# Patient Record
Sex: Male | Born: 1945 | Race: Black or African American | Hispanic: No | Smoking: Former smoker
Health system: Southern US, Community
[De-identification: ages and names within clinical notes are randomized; demographics above are authoritative.]

## PROBLEM LIST (undated history)

## (undated) DIAGNOSIS — R911 Solitary pulmonary nodule: Secondary | ICD-10-CM

## (undated) DIAGNOSIS — N3090 Cystitis, unspecified without hematuria: Secondary | ICD-10-CM

## (undated) DIAGNOSIS — N410 Acute prostatitis: Secondary | ICD-10-CM

## (undated) HISTORY — DX: Solitary pulmonary nodule: R91.1

## (undated) HISTORY — DX: Cystitis, unspecified without hematuria: N30.90

## (undated) HISTORY — DX: Acute prostatitis: N41.0

---

## 2007-08-19 HISTORY — PX: COLONOSCOPY: SHX174

## 2007-08-19 LAB — HM COLONOSCOPY

## 2015-07-02 DIAGNOSIS — R109 Unspecified abdominal pain: Secondary | ICD-10-CM | POA: Diagnosis not present

## 2015-07-02 DIAGNOSIS — Z743 Need for continuous supervision: Secondary | ICD-10-CM | POA: Diagnosis not present

## 2016-04-08 LAB — URINALYSIS, COMPLETE
Bilirubin, UA: NEGATIVE
GLUCOSE, UA: NEGATIVE
Ketones, UA: NEGATIVE
NITRITE UA: NEGATIVE
OCCULT 6: NEGATIVE
PH UA: 7 (ref 4.5–8.0)
PROTEIN UA: NEGATIVE
Prostate Specific Ag, Serum: 2.6
SPEC GRAV UA: 1.008
Urobilinogen, Ur: 0.2

## 2016-05-02 ENCOUNTER — Ambulatory Visit: Payer: Medicare (Managed Care) | Admitting: Internal Medicine

## 2016-08-06 ENCOUNTER — Encounter: Payer: Self-pay | Admitting: Internal Medicine

## 2016-08-06 ENCOUNTER — Ambulatory Visit (INDEPENDENT_AMBULATORY_CARE_PROVIDER_SITE_OTHER): Payer: Medicare Other | Admitting: Internal Medicine

## 2016-08-06 VITALS — BP 124/82 | HR 90 | Temp 98.4°F | Resp 12 | Ht 74.0 in | Wt 208.0 lb

## 2016-08-06 DIAGNOSIS — R918 Other nonspecific abnormal finding of lung field: Secondary | ICD-10-CM

## 2016-08-06 DIAGNOSIS — Z87891 Personal history of nicotine dependence: Secondary | ICD-10-CM

## 2016-08-06 DIAGNOSIS — F1290 Cannabis use, unspecified, uncomplicated: Secondary | ICD-10-CM

## 2016-08-06 DIAGNOSIS — R61 Generalized hyperhidrosis: Secondary | ICD-10-CM | POA: Insufficient documentation

## 2016-08-06 DIAGNOSIS — Z77090 Contact with and (suspected) exposure to asbestos: Secondary | ICD-10-CM

## 2016-08-06 DIAGNOSIS — Z136 Encounter for screening for cardiovascular disorders: Secondary | ICD-10-CM | POA: Diagnosis not present

## 2016-08-06 LAB — CBC WITH DIFFERENTIAL/PLATELET
BASOS ABS: 0 {cells}/uL (ref 0–200)
Basophils Relative: 0 %
EOS ABS: 58 {cells}/uL (ref 15–500)
Eosinophils Relative: 1 %
HEMATOCRIT: 44 % (ref 38.5–50.0)
HEMOGLOBIN: 14.8 g/dL (ref 13.2–17.1)
LYMPHS ABS: 1914 {cells}/uL (ref 850–3900)
LYMPHS PCT: 33 %
MCH: 28.8 pg (ref 27.0–33.0)
MCHC: 33.6 g/dL (ref 32.0–36.0)
MCV: 85.6 fL (ref 80.0–100.0)
MONO ABS: 580 {cells}/uL (ref 200–950)
MPV: 9.9 fL (ref 7.5–12.5)
Monocytes Relative: 10 %
NEUTROS PCT: 56 %
Neutro Abs: 3248 cells/uL (ref 1500–7800)
Platelets: 240 10*3/uL (ref 140–400)
RBC: 5.14 MIL/uL (ref 4.20–5.80)
RDW: 14.6 % (ref 11.0–15.0)
WBC: 5.8 10*3/uL (ref 3.8–10.8)

## 2016-08-06 NOTE — Progress Notes (Signed)
Patient ID: Theodore Johnson, male   DOB: [birth year], 70 y.o.   MRN: [id number]    Location:  [facility] Place of Service: OFFICE    Advanced Directive information    Chief Complaint  Patient presents with  . Establish Care    New Patient Establish Care, patient moved here from [place]. Patient concerned about nodule found on lung via Xray in [place]. Patient fasting for any labs due     HPI:  70 yo male seen today as a new pt. He is c/a pulmonary nodule dx 21 yrs ago but he was lost to f/u. He had imaging done in 2016 and was referred to pulmonary. 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 He did not always wear a mask when removing asbestose. No hemoptysis, weight loss. He does have night sweats mostly affects pillowcase. No entire body sweats. He stopped smoking 91yr ago but uses marijuana occasionally.  He relocated from [place] in Feb 2017. He is retired from g[employer] Corporationwork as wBuilding control surveyor Mom expired at age [age]on Jan 16, 2016 from natural causes  Past Medical History:  Diagnosis Date  . Cystitis   . Prostatitis, acute   . Solitary pulmonary nodule     Past Surgical History:  Procedure Laterality Date  . COLONOSCOPY  08/19/2007    Patient Care Team: MGildardo Cranker DO as PCP - General (Internal Medicine)  Social History   Social History  . Marital status: Divorced    Spouse name: N/A  . Number of children: N/A  . Years of education: N/A   Occupational History  . Not on file.   Social History Main Topics  . Smoking status: Former SResearch scientist (life sciences) . Smokeless tobacco: Never Used     Comment: Quit 37 years ago as of 08/06/16  . Alcohol use Yes  . Drug use: No  . Sexual activity: Not on file   Other Topics Concern  . Not on file   Social History Narrative   Diet? N/A      Do you drink/eat things with caffeine? no      Marital status?      divorced                              What year were you married? N/A      Do you live in a house, apartment, assisted living, condo, trailer, etc.? apartment      Is it one or more stories? no      How many persons live in your home? 1      Do you have any pets in your home? (please list) no      Current or past profession: welder      Do you exercise?           yes                           Type & how often?      Do you have a living will? no      Do you have a DNR form?  yes                                If not, do you want to discuss one?  Do you have signed POA/HPOA for forms?  no        reports that he has quit smoking. He has never used smokeless tobacco. He reports that he drinks alcohol. He reports that he does not use drugs.  Family History  Problem Relation Age of Onset  . Hypertension Mother    Family Status  Relation Status  . Mother Deceased  . Father Deceased at age 63  . Sister Deceased  . Brother Alive  . Brother Alive  . Brother Alive  . Brother Deceased  . Brother Alive  . Brother Alive  . Daughter Alive  . Daughter Alive  . Son Alive  . Daughter Alive  . Daughter Alive  . Son Alive     There is no immunization history on file for this patient.  No Known Allergies  Medications: Patient's Medications   No medications on file    Review of Systems  Constitutional: Positive for diaphoresis.  All other systems reviewed and are negative.   Vitals:   08/06/16 1112  BP: 124/82  Pulse: 90  Resp: 12  Temp: 98.4 F (36.9 C)  TempSrc: Oral  SpO2: 98%  Weight: 208 lb (94.3 kg)  Height: 6' 2"  (1.88 m)   Body mass index is 26.71 kg/m.  Physical Exam  Constitutional: He is oriented to person, place, and time. He appears well-developed and well-nourished.  HENT:  Mouth/Throat: Oropharynx is clear and moist.  Eyes: Pupils are equal, round, and reactive to light. No scleral icterus.  Neck: Neck supple. Carotid bruit is not present. No thyromegaly present.  Cardiovascular: Normal rate, regular rhythm,  normal heart sounds and intact distal pulses.  Exam reveals no gallop and no friction rub.   No murmur heard. no distal LE swelling. No calf TTP  Pulmonary/Chest: Effort normal. He has decreased breath sounds (b/l throughout). He has no wheezes. He has no rhonchi. He has rales. He exhibits no tenderness.  Abdominal: Soft. Bowel sounds are normal. He exhibits no distension, no abdominal bruit, no pulsatile midline mass and no mass. There is no hepatomegaly. There is no tenderness. There is no rebound and no guarding.  Musculoskeletal: He exhibits edema.  Lymphadenopathy:    He has no cervical adenopathy.  Neurological: He is alert and oriented to person, place, and time.  Skin: Skin is warm and dry. No rash noted.  Psychiatric: He has a normal mood and affect. His behavior is normal. Judgment and thought content normal.     Labs reviewed: No visits with results within 3 Month(s) from this visit.  Latest known visit with results is:  Abstract on 04/17/2016  Component Date Value Ref Range Status  . HM Colonoscopy 08/19/2007 Patient Reported  See Report (in chart), Patient Reported Final    No results found.   Assessment/Plan   ICD-9-CM ICD-10-CM   1. Pulmonary nodules 793.19 R91.8 DG Chest 2 View  2. Screening for cardiovascular condition V81.2 Z13.6 CMP with eGFR     Lipid Panel  3. Night sweats 780.8 R61 CMP with eGFR     CBC with Differential/Platelets     TSH     Urinalysis with Reflex Microscopic  4. Exposure to asbestos V15.84 Z77.090 CMP with eGFR     CBC with Differential/Platelets  5. History of tobacco abuse V15.82 Z87.891 DG Chest 2 View  6. 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 S. Perlie Gold  [facility] [address] [place], [place] [address] [phone] Cell (Monday-Friday 8 AM - 5 PM) [phone] After 5 PM and follow prompts

## 2016-08-06 NOTE — Patient Instructions (Signed)
Will call with lab results and imaging results  Get old records  Follow up for AWV/CPE next available

## 2016-08-07 LAB — COMPLETE METABOLIC PANEL WITH GFR
ALT: 21 U/L (ref 9–46)
AST: 21 U/L (ref 10–35)
Albumin: 4.4 g/dL (ref 3.6–5.1)
Alkaline Phosphatase: 58 U/L (ref 40–115)
BUN: 11 mg/dL (ref 7–25)
CHLORIDE: 105 mmol/L (ref 98–110)
CO2: 26 mmol/L (ref 20–31)
CREATININE: 0.99 mg/dL (ref 0.70–1.25)
Calcium: 10 mg/dL (ref 8.6–10.3)
GFR, Est African American: >89 mL/min (ref >=60)
GFR, Est Non African American: 78 mL/min (ref >=60)
GLUCOSE: 83 mg/dL (ref 65–99)
POTASSIUM: 4.6 mmol/L (ref 3.5–5.3)
SODIUM: 141 mmol/L (ref 135–146)
Total Bilirubin: 0.7 mg/dL (ref 0.2–1.2)
Total Protein: 8 g/dL (ref 6.1–8.1)

## 2016-08-07 LAB — URINALYSIS, MICROSCOPIC ONLY
Bacteria, UA: NONE SEEN [HPF]
CRYSTALS: NONE SEEN [HPF]
Casts: NONE SEEN [LPF]
Yeast: NONE SEEN [HPF]

## 2016-08-07 LAB — URINALYSIS, ROUTINE W REFLEX MICROSCOPIC
Bilirubin Urine: NEGATIVE
Glucose, UA: NEGATIVE
HGB URINE DIPSTICK: NEGATIVE
KETONES UR: NEGATIVE
NITRITE: NEGATIVE
PH: 5.5 (ref 5.0–8.0)
PROTEIN: NEGATIVE
Specific Gravity, Urine: 1.023 (ref 1.001–1.035)

## 2016-08-07 LAB — TSH: TSH: 2.13 m[IU]/L (ref 0.40–4.50)

## 2016-08-07 LAB — LIPID PANEL
Cholesterol: 308 mg/dL — ABNORMAL HIGH (ref <200)
HDL: 56 mg/dL (ref >40)
LDL CALC: 201 mg/dL — AB (ref <100)
TRIGLYCERIDES: 256 mg/dL — AB (ref <150)
Total CHOL/HDL Ratio: 5.5 Ratio — ABNORMAL HIGH (ref <5.0)
VLDL: 51 mg/dL — AB (ref <30)

## 2016-08-12 ENCOUNTER — Other Ambulatory Visit: Payer: Self-pay

## 2016-08-12 DIAGNOSIS — E785 Hyperlipidemia, unspecified: Secondary | ICD-10-CM

## 2016-08-12 MED ORDER — ATORVASTATIN CALCIUM 20 MG PO TABS: 20.0000 mg | ORAL_TABLET | Freq: Every day | ORAL | 6 refills | Status: DC

## 2016-08-12 NOTE — Addendum Note (Signed)
Addended by: Chriss DriverLANE, TERRY L on: 08/12/2016 09:22 AM   Modules accepted: Orders

## 2016-09-02 ENCOUNTER — Other Ambulatory Visit: Payer: Self-pay | Admitting: *Deleted

## 2016-09-02 MED ORDER — ATORVASTATIN CALCIUM 20 MG PO TABS: 20.0000 mg | ORAL_TABLET | Freq: Every day | ORAL | 6 refills | Status: DC

## 2016-09-02 NOTE — Telephone Encounter (Signed)
Patient wanted a Rx faxed to [facility] instead of [facility] because it is cheaper. Faxed.

## 2016-09-12 ENCOUNTER — Other Ambulatory Visit: Payer: Self-pay

## 2016-09-19 ENCOUNTER — Other Ambulatory Visit: Payer: Self-pay

## 2016-09-24 ENCOUNTER — Other Ambulatory Visit: Payer: Self-pay

## 2016-10-07 ENCOUNTER — Other Ambulatory Visit: Payer: Medicare HMO

## 2016-10-07 DIAGNOSIS — E785 Hyperlipidemia, unspecified: Secondary | ICD-10-CM | POA: Diagnosis not present

## 2016-10-07 LAB — LIPID PANEL
CHOL/HDL RATIO: 6.3 ratio — AB (ref <5.0)
Cholesterol: 285 mg/dL — ABNORMAL HIGH (ref <200)
HDL: 45 mg/dL (ref >40)
LDL Cholesterol: 189 mg/dL — ABNORMAL HIGH (ref <100)
Triglycerides: 255 mg/dL — ABNORMAL HIGH (ref <150)
VLDL: 51 mg/dL — ABNORMAL HIGH (ref <30)

## 2016-10-07 LAB — ALT: ALT: 18 U/L (ref 9–46)

## 2016-10-17 ENCOUNTER — Ambulatory Visit: Payer: Medicare (Managed Care)

## 2016-10-17 ENCOUNTER — Encounter: Payer: Medicare HMO | Admitting: Internal Medicine

## 2016-10-17 ENCOUNTER — Ambulatory Visit (INDEPENDENT_AMBULATORY_CARE_PROVIDER_SITE_OTHER): Payer: Medicare HMO

## 2016-10-17 ENCOUNTER — Ambulatory Visit (INDEPENDENT_AMBULATORY_CARE_PROVIDER_SITE_OTHER): Payer: Medicare HMO | Admitting: Internal Medicine

## 2016-10-17 ENCOUNTER — Encounter: Payer: Self-pay | Admitting: Internal Medicine

## 2016-10-17 VITALS — BP 150/82 | HR 72 | Temp 98.0°F | Ht 74.0 in | Wt 211.6 lb

## 2016-10-17 DIAGNOSIS — F1290 Cannabis use, unspecified, uncomplicated: Secondary | ICD-10-CM | POA: Diagnosis not present

## 2016-10-17 DIAGNOSIS — Z0189 Encounter for other specified special examinations: Secondary | ICD-10-CM

## 2016-10-17 DIAGNOSIS — Z87891 Personal history of nicotine dependence: Secondary | ICD-10-CM

## 2016-10-17 DIAGNOSIS — Z23 Encounter for immunization: Secondary | ICD-10-CM | POA: Diagnosis not present

## 2016-10-17 DIAGNOSIS — Z0000 Encounter for general adult medical examination without abnormal findings: Secondary | ICD-10-CM

## 2016-10-17 DIAGNOSIS — Z77090 Contact with and (suspected) exposure to asbestos: Secondary | ICD-10-CM | POA: Diagnosis not present

## 2016-10-17 DIAGNOSIS — N489 Disorder of penis, unspecified: Secondary | ICD-10-CM | POA: Diagnosis not present

## 2016-10-17 DIAGNOSIS — R918 Other nonspecific abnormal finding of lung field: Secondary | ICD-10-CM | POA: Diagnosis not present

## 2016-10-17 DIAGNOSIS — H543 Unqualified visual loss, both eyes: Secondary | ICD-10-CM

## 2016-10-17 DIAGNOSIS — R69 Illness, unspecified: Secondary | ICD-10-CM | POA: Diagnosis not present

## 2016-10-17 DIAGNOSIS — E782 Mixed hyperlipidemia: Secondary | ICD-10-CM | POA: Diagnosis not present

## 2016-10-17 MED ORDER — ATORVASTATIN CALCIUM 20 MG PO TABS: 20.0000 mg | ORAL_TABLET | Freq: Every day | ORAL | 6 refills | Status: DC

## 2016-10-17 MED ORDER — TADALAFIL 20 MG PO TABS: 20.0000 mg | ORAL_TABLET | Freq: Every day | ORAL | 6 refills | Status: DC | PRN

## 2016-10-17 NOTE — Patient Instructions (Addendum)
Theodore Johnson , Thank you for taking time to come for your Medicare Wellness Visit. I appreciate your ongoing commitment to your health goals. Please review the following plan we discussed and let me know if I can assist you in the future.   These are the goals we discussed: Goals    . <enter goal here>          Starting 10/17/16, I will maintain my current lifestyle.        This is a list of the screening recommended for you and due dates:  Health Maintenance  Topic Date Due  .  Hepatitis C: One time screening is recommended by Center for Disease Control  (CDC) for  adults born from [birth year] through 1964.   1946-01-31  . Flu Shot  08/18/2018*  . Tetanus Vaccine  08/18/2018*  . Pneumonia vaccines (1 of 2 - PCV13) 08/18/2018*  . Colon Cancer Screening  08/18/2017  *Topic was postponed. The date shown is not the original due date.  Preventive Care for Adults  A healthy lifestyle and preventive care can promote health and wellness. Preventive health guidelines for adults include the following key practices.  . A routine yearly physical is a good way to check with your health care provider about your health and preventive screening. It is a chance to share any concerns and updates on your health and to receive a thorough exam.  . Visit your dentist for a routine exam and preventive care every 6 months. Brush your teeth twice a day and floss once a day. Good oral hygiene prevents tooth decay and gum disease.  . The frequency of eye exams is based on your age, health, family medical history, use  of contact lenses, and other factors. Follow your health care provider's ecommendations for frequency of eye exams.  . Eat a healthy diet. Foods like vegetables, fruits, whole grains, low-fat dairy products, and lean protein foods contain the nutrients you need without too many calories. Decrease your intake of foods high in solid fats, added sugars, and salt. Eat the right amount of calories for you. Get  information about a proper diet from your health care provider, if necessary.  . Regular physical exercise is one of the most important things you can do for your health. Most adults should get at least 150 minutes of moderate-intensity exercise (any activity that increases your heart rate and causes you to sweat) each week. In addition, most adults need muscle-strengthening exercises on 2 or more days a week.  Silver Sneakers may be a benefit available to you. To determine eligibility, you may visit the website: www.silversneakers.com or contact program at [phone] Mon-Fri between 8AM-8PM.   . Maintain a healthy weight. The body mass index (BMI) is a screening tool to identify possible weight problems. It provides an estimate of body fat based on height and weight. Your health care provider can find your BMI and can help you achieve or maintain a healthy weight.   For adults 20 years and older: ? A BMI below 18.5 is considered underweight. ? A BMI of 18.5 to 24.9 is normal. ? A BMI of 25 to 29.9 is considered overweight. ? A BMI of 30 and above is considered obese.   . Maintain normal blood lipids and cholesterol levels by exercising and minimizing your intake of saturated fat. Eat a balanced diet with plenty of fruit and vegetables. Blood tests for lipids and cholesterol should begin at age 70 and be repeated every 5  years. If your lipid or cholesterol levels are high, you are over 50, or you are at high risk for heart disease, you may need your cholesterol levels checked more frequently. Ongoing high lipid and cholesterol levels should be treated with medicines if diet and exercise are not working.  . If you smoke, find out from your health care provider how to quit. If you do not use tobacco, please do not start.  . If you choose to drink alcohol, please do not consume more than 2 drinks per day. One drink is considered to be 12 ounces (355 mL) of beer, 5 ounces (148 mL) of wine, or 1.5  ounces (44 mL) of liquor.  . If you are 40-66 years old, ask your health care provider if you should take aspirin to prevent strokes.  . Use sunscreen. Apply sunscreen liberally and repeatedly throughout the day. You should seek shade when your shadow is shorter than you. Protect yourself by wearing long sleeves, pants, a wide-brimmed hat, and sunglasses year round, whenever you are outdoors.  . Once a month, do a whole body skin exam, using a mirror to look at the skin on your back. Tell your health care provider of new moles, moles that have irregular borders, moles that are larger than a pencil eraser, or moles that have changed in shape or color.

## 2016-10-17 NOTE — Patient Instructions (Signed)
Encouraged him to exercise 30-45 minutes 4-5 times per week. Eat a well balanced diet. Avoid smoking. Limit alcohol intake. Wear seatbelt when riding in the car. Wear sun block (SPF >50) when spending extended times outside.  PREVNAR vaccine given today. Will need pneumovax at next visit  Will call with urology referral  REMEMBER TO GET CHEST XRAY  Continue current medications as ordered  Follow up in 6 mos for hyperlipidemia. Fasting lab prior to appt   Fat and Cholesterol Restricted Diet Getting too much fat and cholesterol in your diet may cause health problems. Following this diet helps keep your fat and cholesterol at normal levels. This can keep you from getting sick. What types of fat should I choose?  Choose monosaturated and polyunsaturated fats. These are found in foods such as olive oil, canola oil, flaxseeds, walnuts, almonds, and seeds.  Eat more omega-3 fats. Good choices include salmon, mackerel, sardines, tuna, flaxseed oil, and ground flaxseeds.  Limit saturated fats. These are in animal products such as meats, butter, and cream. They can also be in plant products such as palm oil, palm kernel oil, and coconut oil.  Avoid foods with partially hydrogenated oils in them. These contain trans fats. Examples of foods that have trans fats are stick margarine, some tub margarines, cookies, crackers, and other baked goods. What general guidelines do I need to follow?  Check food labels. Look for the words "trans fat" and "saturated fat."  When preparing a meal:  Fill half of your plate with vegetables and green salads.  Fill one fourth of your plate with whole grains. Look for the word "whole" as the first word in the ingredient list.  Fill one fourth of your plate with lean protein foods.  Eat more foods that have fiber, like apples, carrots, beans, peas, and barley.  Eat more home-cooked foods. Eat less at restaurants and buffets.  Limit or avoid alcohol.  Limit  foods high in starch and sugar.  Limit fried foods.  Cook foods without frying them. Baking, boiling, grilling, and broiling are all great options.  Lose weight if you are overweight. Losing even a small amount of weight can help your overall health. It can also help prevent diseases such as diabetes and heart disease. What foods can I eat? Grains  Whole grains, such as whole wheat or whole grain breads, crackers, cereals, and pasta. Unsweetened oatmeal, bulgur, barley, quinoa, or brown rice. Corn or whole wheat flour tortillas. Vegetables  Fresh or frozen vegetables (raw, steamed, roasted, or grilled). Green salads. Fruits  All fresh, canned (in natural juice), or frozen fruits. Meat and Other Protein Products  Ground beef (85% or leaner), grass-fed beef, or beef trimmed of fat. Skinless chicken or [place]. Ground chicken or [place]. Pork trimmed of fat. All fish and seafood. Eggs. Dried beans, peas, or lentils. Unsalted nuts or seeds. Unsalted canned or dry beans. Dairy  Low-fat dairy products, such as skim or 1% milk, 2% or reduced-fat cheeses, low-fat ricotta or cottage cheese, or plain low-fat yogurt. Fats and Oils  Tub margarines without trans fats. Light or reduced-fat mayonnaise and salad dressings. Avocado. Olive, canola, sesame, or safflower oils. Natural peanut or almond butter (choose ones without added sugar and oil). The items listed above may not be a complete list of recommended foods or beverages. Contact your dietitian for more options.  What foods are not recommended? Grains  White bread. White pasta. White rice. Cornbread. Bagels, pastries, and croissants. Crackers that contain trans fat.  Vegetables  White potatoes. Corn. Creamed or fried vegetables. Vegetables in a cheese sauce. Fruits  Dried fruits. Canned fruit in light or heavy syrup. Fruit juice. Meat and Other Protein Products  Fatty cuts of meat. Ribs, chicken wings, bacon, sausage, bologna, salami, chitterlings,  fatback, hot dogs, bratwurst, and packaged luncheon meats. Liver and organ meats. Dairy  Whole or 2% milk, cream, half-and-half, and cream cheese. Whole milk cheeses. Whole-fat or sweetened yogurt. Full-fat cheeses. Nondairy creamers and whipped toppings. Processed cheese, cheese spreads, or cheese curds. Sweets and Desserts  Corn syrup, sugars, honey, and molasses. Candy. Jam and jelly. Syrup. Sweetened cereals. Cookies, pies, cakes, donuts, muffins, and ice cream. Fats and Oils  Butter, stick margarine, lard, shortening, ghee, or bacon fat. Coconut, palm kernel, or palm oils. Beverages  Alcohol. Sweetened drinks (such as sodas, lemonade, and fruit drinks or punches). The items listed above may not be a complete list of foods and beverages to avoid. Contact your dietitian for more information.  This information is not intended to replace advice given to you by your health care provider. Make sure you discuss any questions you have with your health care provider. Document Released: 02/03/2012 Document Revised: 04/10/2016 Document Reviewed: 11/03/2013 Elsevier Interactive Patient Education  2017 [employer]

## 2016-10-17 NOTE — Progress Notes (Signed)
Subjective:   Theodore Johnson is a 71 y.o. male who presents for an Initial Medicare Annual Wellness Visit.  Review of Systems    Cardiac Risk Factors include: advanced age (>5055men, 58>65 women);male gender;smoking/ tobacco exposure    Objective:    Today's Vitals   10/17/16 0841  BP: (!) 150/82  Pulse: 72  Temp: 98 F (36.7 C)  TempSrc: Oral  SpO2: 98%  Weight: 211 lb 9.6 oz (96 kg)  Height: 6\' 2"  (1.88 m)   Body mass index is 27.17 kg/m.  Current Medications (verified) Outpatient Encounter Prescriptions as of 10/17/2016  Medication Sig  . atorvastatin (LIPITOR) 20 MG tablet Take 1 tablet (20 mg total) by mouth at bedtime. (Patient not taking: Reported on 10/17/2016)   No facility-administered encounter medications on file as of 10/17/2016.     Allergies (verified) Patient has no known allergies.   History: Past Medical History:  Diagnosis Date  . Cystitis   . Prostatitis, acute   . Solitary pulmonary nodule    Past Surgical History:  Procedure Laterality Date  . COLONOSCOPY  08/19/2007   Family History  Problem Relation Age of Onset  . Hypertension Mother   . COPD Father   . Aneurysm Sister    Social History   Occupational History  . Not on file.   Social History Main Topics  . Smoking status: Former Games developermoker  . Smokeless tobacco: Never Used     Comment: Quit 37 years ago as of 08/06/16  . Alcohol use Yes  . Drug use: Yes    Types: Marijuana     Comment: 2-3 times per week   . Sexual activity: Yes   Tobacco Counseling Counseling given: No   Activities of Daily Living In your present state of health, do you have any difficulty performing the following activities: 10/17/2016  Hearing? N  Vision? N  Difficulty concentrating or making decisions? N  Walking or climbing stairs? N  Dressing or bathing? N  Doing errands, shopping? Y  Preparing Food and eating ? N  Using the Toilet? N  In the past six months, have you accidently leaked urine? N  Do  you have problems with loss of bowel control? N  Managing your Medications? N  Managing your Finances? N  Housekeeping or managing your Housekeeping? N  Some recent data might be hidden    Immunizations and Health Maintenance  There is no immunization history on file for this patient. Health Maintenance Due  Topic Date Due  . Hepatitis C Screening  August 27, 1945    Patient Care Team: Kirt BoysMonica Sharmayne Jablon, DO as PCP - General (Internal Medicine)  Indicate any recent Medical Services you may have received from other than [facility] providers in the past year (date may be approximate).    Assessment:   This is a routine wellness examination for Theodore Johnson.  Hearing/Vision screen Hearing Screening Comments: Pt has not had a hearing screen in several years. Pt has noticed some decrease in hearing but does not want to pursue anything at this time. Pt to let us know when he would like a referral for hearing screen.  Vision Screening Comments: [place] eye [place] over [place] in [place]. Pt would like a referral for ophthalmology. Pt currently wears drug store reading glasses only. Struggles with reading things up close.   Dietary issues and exercise activities discussed: Current Exercise Habits: Home exercise routine (Squats, situps, walking ), Time (Minutes): 15, Frequency (Times/Week): 3, Weekly Exercise (Minutes/Week): 45,  Intensity: Moderate, Exercise limited by: None identified  Goals    . <enter goal here>          Starting 10/17/16, I will maintain my current lifestyle.       Depression Screen PHQ 2/9 Scores 10/17/2016 08/06/2016  PHQ - 2 Score 1 0    Fall Risk Fall Risk  10/17/2016 08/06/2016  Falls in the past year? Yes No  Number falls in past yr: 1 -  Injury with Fall? No -    Cognitive Function: MMSE - Mini Mental State Exam 10/17/2016  Orientation to time 5  Orientation to Place 5  Registration 3  Attention/ Calculation 5  Recall 2  Language- name 2 objects 2  Language-  repeat 1  Language- follow 3 step command 3  Language- read & follow direction 1  Write a sentence 1  Copy design 1  Total score 29        Screening Tests Health Maintenance  Topic Date Due  . Hepatitis C Screening  [birth year]  . INFLUENZA VACCINE  08/18/2018 (Originally 03/18/2016)  . TETANUS/TDAP  08/18/2018 (Originally 08/16/1966)  . PNA vac Low Risk Adult (1 of 2 - PCV13) 08/18/2018 (Originally 08/16/2012)  . COLONOSCOPY  08/18/2017        Plan:    I have personally reviewed and addressed the Medicare Annual Wellness questionnaire and have noted the following in the patient's chart:  A. Medical and social history B. Use of alcohol, tobacco or illicit drugs  C. Current medications and supplements D. Functional ability and status E.  Nutritional status F.  Physical activity G. Advance directives H. List of other physicians I.  Hospitalizations, surgeries, and ER visits in previous 12 months J.  Vitals K. Screenings to include hearing, vision, cognitive, depression L. Referrals and appointments - none  In addition, I have reviewed and discussed with patient certain preventive protocols, quality metrics, and best practice recommendations. A written personalized care plan for preventive services as well as general preventive health recommendations were provided to patient.  See attached scanned questionnaire for additional information.   Signed,   Nilda Calamity, LPN Health Advisor   I have reviewed the health advisor's note and was available for consultation. I agree with documentation and plan.   Paddy Walthall S. Ancil Linsey  [facility] [address] [place], [place] [address] [phone] Cell (Monday-Friday 8 AM - 5 PM) [phone] After 5 PM and follow prompts

## 2016-10-17 NOTE — Progress Notes (Signed)
Quick Notes   Health Maintenance:  Due for Hep C.     Abnormal Screen: MMSE-29/30 Passed Clock Test    Patient Concerns:  Pt would like to discuss Cialis today. Also, I put in a referral for C3 and transportation; along with a referral for ophthalmology.    Nurse Concerns:  None

## 2016-10-17 NOTE — Progress Notes (Signed)
Patient ID: Theodore Johnson, male   DOB: [birth year], 71 y.o.   MRN: [id number]   Location:  [facility]  Place of Service:  OFFICE  Provider: Elmon Kirschner, DO  Patient Care Team: Kirt Boys, DO as PCP - General (Internal Medicine)  Extended Emergency Contact Information Primary Emergency Contact: Wyre,Bill Address: [address]          [place], [place] [address] [place] Home Phone: [phone] Mobile Phone: [phone] Relation: Other Secondary Emergency Contact: Smith,Nicole Address: [address]          [place], [place] [address] [place] Mobile Phone: [phone] Relation: Other  Code Status: FULL CODE Goals of Care: Advanced Directive information Advanced Directives 10/17/2016  Does Patient Have a Medical Advance Directive? No  Would patient like information on creating a medical advance directive? Yes (ED - Information included in AVS)     Chief Complaint  Patient presents with  . Annual Exam    Extend Visit  . Immunizations    refused all vaccines  . Other    would like to discuss Cialis  . MMSE    29/30 passed clock drawing    HPI: Patient is a 71 y.o. male seen in today for an annual wellness exam.   He is c/a penis and states 5 mos ago while having intercourse, he leaned over and felt a sharp pain in penis. No ejaculation issues. No erection problems since. Needs RF on cialis and lipitor. He s/w Health navigator about arranging transportation for appts  Pulmonary nodules - dx 21 yrs ago but he was lost to f/u. He had imaging done in 2016 and was referred to pulmonary. 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 He did not always wear a mask when removing asbestose. No hemoptysis, weight loss. He does have night sweats mostly affects pillowcase. No entire body sweats. He stopped smoking 95yrs ago but uses marijuana occasionally.  He  relocated from [place] in Feb 2017. He is retired from [employer] work as Psychologist, occupational. Mom expired at age 59 on Jan 16, 2016 from natural causes  Hyperlipidemia - not taking statin as Rx as he was unable to afford expense due to no insurance. He is now Medicare covered and plans to pick up Rx. Diet consists of a lot of fried chicken consumption. LDL 189 (prev 201); Tchol 285; TG 255; HDL 45    Depression screen [facility] 2/9 10/17/2016 10/17/2016 08/06/2016  Decreased Interest 0 0 0  Down, Depressed, Hopeless 0 1 0  PHQ - 2 Score 0 1 0    Fall Risk  10/17/2016 10/17/2016 08/06/2016  Falls in the past year? Yes Yes No  Number falls in past yr: 1 1 -  Injury with Fall? No No -   MMSE - Mini Mental State Exam 10/17/2016  Orientation to time 5  Orientation to Place 5  Registration 3  Attention/ Calculation 5  Recall 2  Language- name 2 objects 2  Language- repeat 1  Language- follow 3 step command 3  Language- read & follow direction 1  Write a sentence 1  Copy design 1  Total score 29     Health Maintenance  Topic Date Due  . Hepatitis C Screening  1946/01/08  . INFLUENZA VACCINE  08/18/2018 (Originally 03/18/2016)  . TETANUS/TDAP  08/18/2018 (Originally 08/16/1966)  . PNA vac Low Risk Adult (1 of  2 - PCV13) 08/18/2018 (Originally 08/16/2012)  . COLONOSCOPY  08/18/2017   TODAY'S AWV NOTE REVIEWED  Past Medical History:  Diagnosis Date  . Cystitis   . Prostatitis, acute   . Solitary pulmonary nodule     Past Surgical History:  Procedure Laterality Date  . COLONOSCOPY  08/19/2007    Family History  Problem Relation Age of Onset  . Hypertension Mother   . COPD Father   . Aneurysm Sister    Family Status  Relation Status  . Mother Deceased  . Father Deceased at age 70  . Sister Deceased  . Brother Alive  . Brother Alive  . Brother Alive  . Brother Deceased  . Brother Alive  . Brother Alive  . Daughter Alive  . Daughter Alive  . Son Alive  . Daughter Alive  . Daughter Alive  . Son  Alive    shoulder  Social History   Social History  . Marital status: Divorced    Spouse name: N/A  . Number of children: N/A  . Years of education: N/A   Occupational History  . Not on file.   Social History Main Topics  . Smoking status: Former Games developermoker  . Smokeless tobacco: Never Used     Comment: Quit 37 years ago as of 08/06/16  . Alcohol use Yes  . Drug use: Yes    Types: Marijuana     Comment: 2-3 times per week   . Sexual activity: Yes   Other Topics Concern  . Not on file   Social History Narrative   Diet? N/A      Do you drink/eat things with caffeine? no      Marital status?      divorced                              What year were you married? N/A      Do you live in a house, apartment, assisted living, condo, trailer, etc.? apartment      Is it one or more stories? no      How many persons live in your home? 1      Do you have any pets in your home? (please list) no      Current or past profession: welder      Do you exercise?           yes                           Type & how often?      Do you have a living will? no      Do you have a DNR form?  yes                                If not, do you want to discuss one?      Do you have signed POA/HPOA for forms?  no       No Known Allergies  Allergies as of 10/17/2016   No Known Allergies     Medication List       Accurate as of 10/17/16 10:43 AM. Always use your most recent med list.          atorvastatin 20 MG tablet Commonly known as:  LIPITOR Take 1 tablet (20 mg total) by mouth at  bedtime.        Review of Systems:  Review of Systems  Genitourinary: Positive for penile swelling. Negative for discharge, penile pain and scrotal swelling.  All other systems reviewed and are negative.   Physical Exam: Vitals:   10/17/16 1013  BP: (!) 150/82  Pulse: 72  Temp: 98 F (36.7 C)  TempSrc: Oral  SpO2: 98%  Weight: 211 lb 9.6 oz (96 kg)  Height: 6\' 2"  (1.88 m)   Body mass  index is 27.17 kg/m. Physical Exam  Constitutional: He is oriented to person, place, and time. He appears well-developed and well-nourished. No distress.  HENT:  Head: Normocephalic and atraumatic.  Right Ear: Hearing, tympanic membrane, external ear and ear canal normal.  Left Ear: Hearing, tympanic membrane, external ear and ear canal normal.  Mouth/Throat: Uvula is midline, oropharynx is clear and moist and mucous membranes are normal. He does not have dentures.  Eyes: Conjunctivae, EOM and lids are normal. Pupils are equal, round, and reactive to light. No scleral icterus.  Neck: Trachea normal and normal range of motion. Neck supple. Carotid bruit is not present. No thyroid mass and no thyromegaly present.  Cardiovascular: Normal rate, regular rhythm, normal heart sounds and intact distal pulses.  Exam reveals no gallop and no friction rub.   No murmur heard. no distal LE swelling. No calf TTP  Pulmonary/Chest: Effort normal. He has decreased breath sounds (b/l throughout). He has no wheezes. He has no rhonchi. He has rales. He exhibits no tenderness. Right breast exhibits no inverted nipple, no mass, no nipple discharge, no skin change and no tenderness. Left breast exhibits no inverted nipple, no mass, no nipple discharge, no skin change and no tenderness. Breasts are symmetrical.  Abdominal: Soft. Normal appearance, normal aorta and bowel sounds are normal. He exhibits no distension, no abdominal bruit, no pulsatile midline mass and no mass. There is no hepatosplenomegaly or hepatomegaly. There is no tenderness. There is no rigidity, no rebound and no guarding. No hernia. Hernia confirmed negative in the right inguinal area and confirmed negative in the left inguinal area.  Genitourinary: Rectum normal and testes normal. Rectal exam shows guaiac negative stool.    Uncircumcised. No penile erythema or penile tenderness. No discharge found.  Musculoskeletal: Normal range of motion. He  exhibits edema.  Lymphadenopathy:       Head (right side): No posterior auricular adenopathy present.       Head (left side): No posterior auricular adenopathy present.    He has no cervical adenopathy.       Right: No supraclavicular adenopathy present.       Left: No supraclavicular adenopathy present.  Neurological: He is alert and oriented to person, place, and time. He has normal strength and normal reflexes. No cranial nerve deficit. Gait normal.  Skin: Skin is warm, dry and intact. No rash noted. Nails show no clubbing.  Psychiatric: He has a normal mood and affect. His speech is normal and behavior is normal. Judgment and thought content normal. Cognition and memory are normal.    Labs reviewed:  Basic Metabolic Panel:  Recent Labs  16/10/96 1215  NA 141  K 4.6  CL 105  CO2 26  GLUCOSE 83  BUN 11  CREATININE 0.99  CALCIUM 10.0  TSH 2.13   Liver Function Tests:  Recent Labs  08/06/16 1215 10/07/16 0955  AST 21  --   ALT 21 18  ALKPHOS 58  --   BILITOT 0.7  --  PROT 8.0  --   ALBUMIN 4.4  --    No results for input(s): LIPASE, AMYLASE in the last 8760 hours. No results for input(s): AMMONIA in the last 8760 hours. CBC:  Recent Labs  08/06/16 1215  WBC 5.8  NEUTROABS 3,248  HGB 14.8  HCT 44.0  MCV 85.6  PLT 240   Lipid Panel:  Recent Labs  08/06/16 1215 10/07/16 0955  CHOL 308* 285*  HDL 56 45  LDLCALC 201* 189*  TRIG 256* 255*  CHOLHDL 5.5* 6.3*   No results found for: HGBA1C  Procedures: No results found. ECG OBTAINED AND REVIEWED BY MYSELF: NSR @ 60 bpm, LAD, LAE, poor R wave progression. No acute ischemic changes. No other ECG available to compare  Assessment/Plan   ICD-9-CM ICD-10-CM   1. Well adult exam V70.0 Z00.00   2. Penile abnormality 607.9 N48.9 Ambulatory referral to Urology  3. Pulmonary nodules 793.19 R91.8   4. Exposure to asbestos V15.84 Z77.090   5. 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 History of tobacco  abuse V15.82 Z87.891 Pneumococcal conjugate vaccine 13-valent  7. Mixed hyperlipidemia 272.2 E78.2 atorvastatin (LIPITOR) 20 MG tablet  8. Need for vaccination with 13-polyvalent pneumococcal conjugate vaccine V03.82 Z23 Pneumococcal conjugate vaccine 13-valent    Pt is UTD on health maintenance. Vaccinations are UTD. Pt maintains a healthy lifestyle. Encouraged pt to exercise 30-45 minutes 4-5 times per week. Eat a well balanced diet. Avoid smoking. Limit alcohol intake. Wear seatbelt when riding in the car. Wear sun block (SPF >50) when spending extended times outside.  PREVNAR vaccine given today. Will need pneumovax at next visit  Will call with urology referral  REMEMBER TO GET CHEST XRAY  Continue current medications as ordered  Follow up in 6 mos for hyperlipidemia. Fasting lab prior to appt   [facility] S. Ancil Linsey  [facility] [address] [place], [place] [address] [phone] Cell (Monday-Friday 8 AM - 5 PM) [phone] After 5 PM and follow prompts

## 2016-12-11 ENCOUNTER — Other Ambulatory Visit: Payer: Self-pay

## 2016-12-11 DIAGNOSIS — E782 Mixed hyperlipidemia: Secondary | ICD-10-CM

## 2016-12-11 MED ORDER — ATORVASTATIN CALCIUM 20 MG PO TABS: 20.0000 mg | ORAL_TABLET | Freq: Every day | ORAL | 1 refills | Status: DC

## 2016-12-11 MED ORDER — TADALAFIL 20 MG PO TABS: 20.0000 mg | ORAL_TABLET | Freq: Every day | ORAL | 6 refills | Status: DC | PRN

## 2016-12-25 NOTE — Progress Notes (Signed)
This encounter was created in error - please disregard.

## 2017-01-01 ENCOUNTER — Telehealth: Payer: Self-pay | Admitting: Internal Medicine

## 2017-01-01 NOTE — Telephone Encounter (Signed)
Attempted to reach the patient to follow up on community resource referral for transportation.  There was no answer and no option to leave a voicemail message. VDM (DD)

## 2017-01-13 DIAGNOSIS — N486 Induration penis plastica: Secondary | ICD-10-CM | POA: Diagnosis not present

## 2017-01-13 DIAGNOSIS — N4889 Other specified disorders of penis: Secondary | ICD-10-CM | POA: Diagnosis not present

## 2017-01-13 DIAGNOSIS — N401 Enlarged prostate with lower urinary tract symptoms: Secondary | ICD-10-CM | POA: Diagnosis not present

## 2017-01-13 DIAGNOSIS — R351 Nocturia: Secondary | ICD-10-CM | POA: Diagnosis not present

## 2017-01-16 ENCOUNTER — Ambulatory Visit (INDEPENDENT_AMBULATORY_CARE_PROVIDER_SITE_OTHER): Payer: Medicare HMO

## 2017-01-16 ENCOUNTER — Encounter (HOSPITAL_COMMUNITY): Payer: Self-pay | Admitting: Family Medicine

## 2017-01-16 ENCOUNTER — Ambulatory Visit (HOSPITAL_COMMUNITY)
Admission: EM | Admit: 2017-01-16 | Discharge: 2017-01-16 | Disposition: A | Discharge status: Home / Self Care | Payer: Medicare HMO | Attending: Emergency Medicine | Admitting: Emergency Medicine

## 2017-01-16 DIAGNOSIS — R0781 Pleurodynia: Secondary | ICD-10-CM | POA: Diagnosis not present

## 2017-01-16 DIAGNOSIS — S29019A Strain of muscle and tendon of unspecified wall of thorax, initial encounter: Secondary | ICD-10-CM

## 2017-01-16 DIAGNOSIS — R079 Chest pain, unspecified: Secondary | ICD-10-CM | POA: Diagnosis not present

## 2017-01-16 DIAGNOSIS — Z8619 Personal history of other infectious and parasitic diseases: Secondary | ICD-10-CM

## 2017-01-16 LAB — POCT URINALYSIS DIP (DEVICE)
Bilirubin Urine: NEGATIVE
GLUCOSE, UA: NEGATIVE mg/dL
Hgb urine dipstick: NEGATIVE
Ketones, ur: NEGATIVE mg/dL
Nitrite: NEGATIVE
PH: 5.5 (ref 5.0–8.0)
Protein, ur: NEGATIVE mg/dL
SPECIFIC GRAVITY, URINE: 1.02 (ref 1.005–1.030)
UROBILINOGEN UA: 0.2 mg/dL (ref 0.0–1.0)

## 2017-01-16 MED ORDER — ACYCLOVIR 400 MG PO TABS: 400.0000 mg | ORAL_TABLET | Freq: Two times a day (BID) | ORAL | 0 refills | Status: DC

## 2017-01-16 MED ORDER — ORPHENADRINE CITRATE ER 100 MG PO TB12: 100.0000 mg | ORAL_TABLET | Freq: Two times a day (BID) | ORAL | 0 refills | Status: DC

## 2017-01-16 MED ORDER — CIPROFLOXACIN HCL 500 MG PO TABS: 500.0000 mg | ORAL_TABLET | Freq: Two times a day (BID) | ORAL | 0 refills | Status: DC

## 2017-01-16 NOTE — Discharge Instructions (Signed)
Push fluids, take meds as directed. Follow up with PCP for recheck. Return to UC as needed

## 2017-01-16 NOTE — ED Triage Notes (Signed)
Pt here for right flank pain that started 4 days ago. sts sharp stabbing. sts hx of shingles on the side. sts also some itching but no rash.

## 2017-01-16 NOTE — ED Provider Notes (Signed)
CSN: [id number]     Arrival date & time 01/16/17  1013 History   None    Chief Complaint  Patient presents with  . Flank Pain   (Consider location/radiation/quality/duration/timing/severity/associated sxs/prior Treatment) 71 yr old AA male presents to UC with cc of right lateral thoracic pain x 4 days worse with movement or cough. Pt denies any recent injury, reports + hx of shingles on right side in past, denies rash,denies fever. Took tylenol last night with relief. Pt also reports change in stream strength when urinating, denies dysuria or discharge.    The history is provided by the patient. No language interpreter was used.    Past Medical History:  Diagnosis Date  . Cystitis   . Prostatitis, acute   . Solitary pulmonary nodule    Past Surgical History:  Procedure Laterality Date  . COLONOSCOPY  08/19/2007   Family History  Problem Relation Age of Onset  . Hypertension Mother   . COPD Father   . Aneurysm Sister    Social History  Substance Use Topics  . Smoking status: Former Games developermoker  . Smokeless tobacco: Never Used     Comment: Quit 37 years ago as of 08/06/16  . Alcohol use Yes    Review of Systems  Constitutional: Negative for fever.  HENT: Negative.   Eyes: Negative.   Respiratory: Negative for cough.   Cardiovascular: Negative for palpitations.       Right sided chest wall pain  Gastrointestinal: Negative.   Genitourinary:       Change in stream  Musculoskeletal: Positive for myalgias.  Skin: Negative for rash.  Hematological: Negative.   Psychiatric/Behavioral: Negative.   All other systems reviewed and are negative.   Allergies  Patient has no known allergies.  Home Medications   Prior to Admission medications   Medication Sig Start Date End Date Taking? Authorizing Provider  acyclovir (ZOVIRAX) 400 MG tablet Take 1 tablet (400 mg total) by mouth 2 (two) times daily. 01/16/17   Gloriajean Okun, Para MarchJeanette, NP  atorvastatin (LIPITOR) 20 MG tablet Take 1  tablet (20 mg total) by mouth at bedtime. 12/11/16   Kirt Boysarter, Monica, DO  ciprofloxacin (CIPRO) 500 MG tablet Take 1 tablet (500 mg total) by mouth 2 (two) times daily. 01/16/17   Pistol Kessenich, Para MarchJeanette, NP  orphenadrine (NORFLEX) 100 MG tablet Take 1 tablet (100 mg total) by mouth 2 (two) times daily. 01/16/17   Krislyn Donnan, Para MarchJeanette, NP  tadalafil (CIALIS) 20 MG tablet Take 1 tablet (20 mg total) by mouth daily as needed for erectile dysfunction. 12/11/16   Kirt Boysarter, Monica, DO   Meds Ordered and Administered this Visit  Medications - No data to display  BP 130/70 (BP Location: Right Arm)   Pulse 63   Temp 98.2 F (36.8 C) (Oral)   Resp 16   SpO2 98%  No data found.   Physical Exam  Constitutional: He is oriented to person, place, and time. He appears well-developed and well-nourished. He is active and cooperative. No distress.  HENT:  Head: Normocephalic.  Eyes: Pupils are equal, round, and reactive to light.  Cardiovascular: Normal rate, regular rhythm, normal heart sounds, intact distal pulses and normal pulses.   Pulmonary/Chest: Effort normal and breath sounds normal.     He exhibits tenderness.  Abdominal: Soft. There is no tenderness.  Musculoskeletal:  No CVAT   Neurological: He is alert and oriented to person, place, and time. GCS eye subscore is 4. GCS verbal subscore is 5. GCS motor subscore is  6.  Psychiatric: He has a normal mood and affect. His speech is normal and behavior is normal. Thought content normal.  Nursing note reviewed.   Urgent Care Course     Procedures (including critical care time)  Labs Review Labs Reviewed  POCT URINALYSIS DIP (DEVICE) - Abnormal; Notable for the following:       Result Value   Leukocytes, UA SMALL (*)    All other components within normal limits    Imaging Review Dg Ribs Unilateral W/chest Right  Result Date: 01/16/2017 CLINICAL DATA:  Right-sided chest and rib pain beginning last night. EXAM: RIGHT RIBS AND CHEST - 3+ VIEW  COMPARISON:  None. FINDINGS: Heart size is normal. There is aortic atherosclerosis. The lungs are clear. The vascularity is normal. No rib abnormality seen. Ordinary degenerative changes affect the spine. IMPRESSION: No active cardiopulmonary disease. Aortic atherosclerosis. No rib abnormality seen on the right. Electronically Signed   By: Paulina Fusi M.D.   On: 01/16/2017 12:38         MDM   1. Thoracic myofascial strain, initial encounter   2. History of shingles     UA shows slight leukocytes, CXR right rib negative. Most likely thsi is thoracic strain accompanied by early prostatitis, will treat with norflex and abx. With hx of shingles will also script for acyclovir.    Clancy Gourd, NP 01/16/17 1742

## 2017-01-30 DIAGNOSIS — Z0000 Encounter for general adult medical examination without abnormal findings: Secondary | ICD-10-CM | POA: Diagnosis not present

## 2017-01-30 DIAGNOSIS — N529 Male erectile dysfunction, unspecified: Secondary | ICD-10-CM | POA: Diagnosis not present

## 2017-01-30 DIAGNOSIS — Z87891 Personal history of nicotine dependence: Secondary | ICD-10-CM | POA: Diagnosis not present

## 2017-01-30 DIAGNOSIS — E785 Hyperlipidemia, unspecified: Secondary | ICD-10-CM | POA: Diagnosis not present

## 2017-01-30 DIAGNOSIS — Z6830 Body mass index (BMI) 30.0-30.9, adult: Secondary | ICD-10-CM | POA: Diagnosis not present

## 2017-01-30 DIAGNOSIS — E669 Obesity, unspecified: Secondary | ICD-10-CM | POA: Diagnosis not present

## 2017-01-30 DIAGNOSIS — Z79899 Other long term (current) drug therapy: Secondary | ICD-10-CM | POA: Diagnosis not present

## 2017-01-30 DIAGNOSIS — K029 Dental caries, unspecified: Secondary | ICD-10-CM | POA: Diagnosis not present

## 2017-02-14 ENCOUNTER — Encounter (HOSPITAL_COMMUNITY): Payer: Self-pay | Admitting: Emergency Medicine

## 2017-02-14 ENCOUNTER — Ambulatory Visit (HOSPITAL_COMMUNITY)
Admission: EM | Admit: 2017-02-14 | Discharge: 2017-02-14 | Disposition: A | Discharge status: Home / Self Care | Payer: Medicare HMO | Attending: Family Medicine | Admitting: Family Medicine

## 2017-02-14 DIAGNOSIS — H0011 Chalazion right upper eyelid: Secondary | ICD-10-CM | POA: Diagnosis not present

## 2017-02-14 NOTE — ED Triage Notes (Signed)
Pt c/o stye on right upper eye lid onset 3 months  Denies pain at the moment  A&O x4... NAD... Ambulatory

## 2017-02-14 NOTE — ED Provider Notes (Signed)
CSN: [id number]     Arrival date & time 02/14/17  1256 History   First MD Initiated Contact with Patient 02/14/17 1431     Chief Complaint  Patient presents with  . Stye   (Consider location/radiation/quality/duration/timing/severity/associated sxs/prior Treatment) Patient complaints of a stye for 3 month with no relief from warm compress. He would like a prescription for Tobradex ointment. He denies any visual changes. He denies the eye pain, eye redness or irritation. He denies fever. He states the "Bump" is smaller in the morning.          Past Medical History:  Diagnosis Date  . Cystitis   . Prostatitis, acute   . Solitary pulmonary nodule    Past Surgical History:  Procedure Laterality Date  . COLONOSCOPY  08/19/2007   Family History  Problem Relation Age of Onset  . Hypertension Mother   . COPD Father   . Aneurysm Sister    Social History  Substance Use Topics  . Smoking status: Former Games developermoker  . Smokeless tobacco: Never Used     Comment: Quit 37 years ago as of 08/06/16  . Alcohol use Yes    Review of Systems  Constitutional:       See HPI    Allergies  Patient has no known allergies.  Home Medications   Prior to Admission medications   Medication Sig Start Date End Date Taking? Authorizing Provider  atorvastatin (LIPITOR) 20 MG tablet Take 1 tablet (20 mg total) by mouth at bedtime. 12/11/16  Yes Kirt Boysarter, Monica, DO  orphenadrine (NORFLEX) 100 MG tablet Take 1 tablet (100 mg total) by mouth 2 (two) times daily. 01/16/17  Yes Defelice, Para MarchJeanette, NP  acyclovir (ZOVIRAX) 400 MG tablet Take 1 tablet (400 mg total) by mouth 2 (two) times daily. 01/16/17   Defelice, Para MarchJeanette, NP  ciprofloxacin (CIPRO) 500 MG tablet Take 1 tablet (500 mg total) by mouth 2 (two) times daily. 01/16/17   Defelice, Para MarchJeanette, NP  tadalafil (CIALIS) 20 MG tablet Take 1 tablet (20 mg total) by mouth daily as needed for erectile dysfunction. 12/11/16   Kirt Boysarter, Monica, DO   Meds Ordered and  Administered this Visit  Medications - No data to display  BP (!) 141/75 (BP Location: Left Arm)   Pulse 78   Temp 98.5 F (36.9 C) (Oral)   Resp 16   SpO2 96%  No data found.   Physical Exam  Constitutional: He appears well-developed and well-nourished.  HENT:  Has a non-tender rubbery nodules about 5 mm in size on right upper eyelid without eyelid swelling or inflammation.   Cardiovascular: Normal rate, regular rhythm and normal heart sounds.   Pulmonary/Chest: Effort normal and breath sounds normal. He has no wheezes.  Neurological: He is alert.  Nursing note and vitals reviewed.   Urgent Care Course     Procedures (including critical care time)  Labs Review Labs Reviewed - No data to display  Imaging Review No results found.   MDM   1. Chalazion of right upper eyelid    Clinical presentation consistent with Chalazion. Education provide and informed that  antibiotic is not indicated. Patient referred to ophthalmologist given that the lesion has remained persistent.    Lucia EstelleZheng, Kallan Merrick, NP 02/14/17 1451

## 2017-02-26 ENCOUNTER — Telehealth: Payer: Self-pay | Admitting: Internal Medicine

## 2017-02-26 NOTE — Telephone Encounter (Signed)
I followed up with the patient to ask if he had been set up with [employer] for transportation.  The patient stated that he didn't complete it because he now has his own transportation.  He also stated that he has an application for medication assistance that he will be bringing to the office. VDM (DD)

## 2017-02-26 NOTE — Telephone Encounter (Signed)
Pt came in to drop off patient assistance application from [employer] for Viagra.  He requested that Dr. Montez Moritaarter complete it because his urologist has retired.  Please let me know when completed.  Thanks. VDM (DD)

## 2017-03-09 NOTE — Telephone Encounter (Signed)
[employer] Patient Assistance Form filled out and faxed to [employer] Fax#:[phone]

## 2017-03-11 ENCOUNTER — Ambulatory Visit: Payer: Medicare HMO | Admitting: Urology

## 2017-03-11 ENCOUNTER — Encounter: Payer: Self-pay | Admitting: Urology

## 2017-03-12 ENCOUNTER — Telehealth: Payer: Self-pay | Admitting: Internal Medicine

## 2017-03-12 NOTE — Telephone Encounter (Signed)
Error

## 2017-03-13 ENCOUNTER — Telehealth: Payer: Self-pay

## 2017-03-13 ENCOUNTER — Encounter: Payer: Medicare HMO | Admitting: Internal Medicine

## 2017-03-13 DIAGNOSIS — E782 Mixed hyperlipidemia: Secondary | ICD-10-CM

## 2017-03-13 NOTE — Telephone Encounter (Signed)
#  30 pills of Viagra received via incoming mail.  Patient aware

## 2017-03-24 ENCOUNTER — Other Ambulatory Visit: Payer: Self-pay | Admitting: Internal Medicine

## 2017-03-24 DIAGNOSIS — Z79899 Other long term (current) drug therapy: Secondary | ICD-10-CM

## 2017-03-24 DIAGNOSIS — E782 Mixed hyperlipidemia: Secondary | ICD-10-CM

## 2017-03-31 DIAGNOSIS — H0011 Chalazion right upper eyelid: Secondary | ICD-10-CM | POA: Diagnosis not present

## 2017-04-06 ENCOUNTER — Ambulatory Visit
Admission: RE | Admit: 2017-04-06 | Discharge: 2017-04-06 | Disposition: A | Discharge status: Home / Self Care | Payer: Medicare HMO | Source: Ambulatory Visit | Attending: Internal Medicine | Admitting: Internal Medicine

## 2017-04-06 DIAGNOSIS — R918 Other nonspecific abnormal finding of lung field: Secondary | ICD-10-CM

## 2017-04-06 DIAGNOSIS — Z87891 Personal history of nicotine dependence: Secondary | ICD-10-CM

## 2017-04-21 ENCOUNTER — Other Ambulatory Visit: Payer: Medicare HMO

## 2017-04-22 ENCOUNTER — Other Ambulatory Visit: Payer: Medicare HMO

## 2017-04-22 DIAGNOSIS — Z79899 Other long term (current) drug therapy: Secondary | ICD-10-CM

## 2017-04-22 DIAGNOSIS — E782 Mixed hyperlipidemia: Secondary | ICD-10-CM

## 2017-04-22 LAB — LIPID PANEL
Cholesterol: 201 mg/dL — ABNORMAL HIGH (ref <200)
HDL: 45 mg/dL (ref >40)
LDL Cholesterol (Calc): 116 mg/dL (calc) — ABNORMAL HIGH
NON-HDL CHOLESTEROL (CALC): 156 mg/dL — AB (ref <130)
TRIGLYCERIDES: 282 mg/dL — AB (ref <150)
Total CHOL/HDL Ratio: 4.5 (calc) (ref <5.0)

## 2017-04-22 LAB — ALT: ALT: 28 U/L (ref 9–46)

## 2017-04-24 ENCOUNTER — Encounter: Payer: Self-pay | Admitting: Internal Medicine

## 2017-04-24 ENCOUNTER — Ambulatory Visit (INDEPENDENT_AMBULATORY_CARE_PROVIDER_SITE_OTHER): Payer: Medicare HMO | Admitting: Internal Medicine

## 2017-04-24 VITALS — BP 132/74 | HR 74 | Temp 98.1°F | Ht 74.0 in | Wt 226.0 lb

## 2017-04-24 DIAGNOSIS — R918 Other nonspecific abnormal finding of lung field: Secondary | ICD-10-CM

## 2017-04-24 DIAGNOSIS — H00011 Hordeolum externum right upper eyelid: Secondary | ICD-10-CM | POA: Diagnosis not present

## 2017-04-24 DIAGNOSIS — Z77090 Contact with and (suspected) exposure to asbestos: Secondary | ICD-10-CM | POA: Diagnosis not present

## 2017-04-24 DIAGNOSIS — Z79899 Other long term (current) drug therapy: Secondary | ICD-10-CM

## 2017-04-24 DIAGNOSIS — N522 Drug-induced erectile dysfunction: Secondary | ICD-10-CM | POA: Diagnosis not present

## 2017-04-24 DIAGNOSIS — Z9189 Other specified personal risk factors, not elsewhere classified: Secondary | ICD-10-CM

## 2017-04-24 DIAGNOSIS — E782 Mixed hyperlipidemia: Secondary | ICD-10-CM

## 2017-04-24 DIAGNOSIS — J8410 Pulmonary fibrosis, unspecified: Secondary | ICD-10-CM | POA: Insufficient documentation

## 2017-04-24 DIAGNOSIS — Z1159 Encounter for screening for other viral diseases: Secondary | ICD-10-CM | POA: Diagnosis not present

## 2017-04-24 DIAGNOSIS — K8020 Calculus of gallbladder without cholecystitis without obstruction: Secondary | ICD-10-CM | POA: Insufficient documentation

## 2017-04-24 MED ORDER — ATORVASTATIN CALCIUM 20 MG PO TABS: 20.0000 mg | ORAL_TABLET | Freq: Every day | ORAL | 1 refills | Status: AC

## 2017-04-24 MED ORDER — TADALAFIL 20 MG PO TABS: 20.0000 mg | ORAL_TABLET | Freq: Every day | ORAL | 6 refills | Status: DC | PRN

## 2017-04-24 NOTE — Progress Notes (Signed)
Patient ID: Theodore Johnson, male   DOB: 1946/04/18, 71 y.o.   MRN: [id number]    Location:  [facility] Place of Service: OFFICE  Chief Complaint  Patient presents with  . Medical Management of Chronic Issues    6 month follow-up and discuss labs (copy printed)   . Immunizations    Refused vaccines   . Medication Refill    Refill lipitor and cialis   . Health Maintenance    Patient would like Hep C screening     HPI:  71 yo male seen today for f/u. He is walking several days per week. He has reduced stress in life. He is eating more grits and bacon. No new CP, SOB, f/c. He would like Hep C screen as his last sexual partner was dx with Hep C.  Pulmonary nodules - dx 21 yrs ago but he was lost to f/u. He had CT chest wo contrast done in 2016 that revealed LLL pulmonary nodule 0.8 cm; fibrotic changes at apices. He was referred to pulmonary but did not get a chance to go to pulmonary. 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 He did not always wear a mask when removing asbestose. No hemoptysis, weight loss. He does have night sweats mostly affects pillowcase. No entire body sweats. He stopped smoking 33yr ago but uses marijuana occasionally. CXR last month showed no acute process. No gross nodules  He relocated from [place] in Feb [place]. He is retired from g[employer] Corporationwork as wBuilding control surveyor Mom expired at age [age]on Jan 16, 2016 from natural causes  Hyperlipidemia - not taking statin as Rx 2/2 ADRs of ED. He does take it 4-5 times per week. Diet changed to low fat/low cholesterol most times. LDL 116 (prev 189); Tchol 201 (prev 285); TG 282; HDL 45  Past Medical History:  Diagnosis Date  . Cystitis   . Prostatitis, acute   . Solitary pulmonary nodule     Past Surgical History:  Procedure Laterality Date  . COLONOSCOPY  08/19/2007    Patient Care Team: CGildardo Cranker DO as PCP - General (Internal Medicine)  Social History   Social  History  . Marital status: Divorced    Spouse name: N/A  . Number of children: N/A  . Years of education: N/A   Occupational History  . Not on file.   Social History Main Topics  . Smoking status: Former SResearch scientist (life sciences) . Smokeless tobacco: Never Used     Comment: Quit 37 years ago as of 08/06/16  . Alcohol use Yes  . Drug use: Yes    Types: Marijuana     Comment: 2-3 times per week   . Sexual activity: Yes   Other Topics Concern  . Not on file   Social History Narrative   Diet? N/A      Do you drink/eat things with caffeine? no      Marital status?      divorced                              What year were you married? N/A      Do you live in a house, apartment, assisted living, condo, trailer, etc.? apartment      Is it one or more stories? no      How many persons live in your home? 1      Do  you have any pets in your home? (please list) no      Current or past profession: welder      Do you exercise?           yes                           Type & how often?      Do you have a living will? no      Do you have a DNR form?  yes                                If not, do you want to discuss one?      Do you have signed POA/HPOA for forms?  no        reports that he has quit smoking. He has never used smokeless tobacco. He reports that he drinks alcohol. He reports that he uses drugs, including Marijuana.  Family History  Problem Relation Age of Onset  . Hypertension Mother   . COPD Father   . Aneurysm Sister    Family Status  Relation Status  . Mother Deceased  . Father Deceased at age 66  . Sister Deceased  . Brother Alive  . Brother Alive  . Brother Alive  . Brother Deceased  . Brother Alive  . Brother Alive  . Daughter Alive  . Daughter Alive  . Son Alive  . Daughter Alive  . Daughter Alive  . Son Alive     No Known Allergies  Medications: Patient's Medications  New Prescriptions   No medications on file  Previous Medications   ATORVASTATIN  (LIPITOR) 20 MG TABLET    Take 1 tablet (20 mg total) by mouth at bedtime.   TADALAFIL (CIALIS) 20 MG TABLET    Take 1 tablet (20 mg total) by mouth daily as needed for erectile dysfunction.  Modified Medications   No medications on file  Discontinued Medications   ACYCLOVIR (ZOVIRAX) 400 MG TABLET    Take 1 tablet (400 mg total) by mouth 2 (two) times daily.   CIPROFLOXACIN (CIPRO) 500 MG TABLET    Take 1 tablet (500 mg total) by mouth 2 (two) times daily.   ORPHENADRINE (NORFLEX) 100 MG TABLET    Take 1 tablet (100 mg total) by mouth 2 (two) times daily.    Review of Systems  Respiratory: Negative for shortness of breath.   Cardiovascular: Negative for chest pain and leg swelling.  Musculoskeletal: Negative for myalgias.  All other systems reviewed and are negative.   Vitals:   04/24/17 1048  BP: 132/74  Pulse: 74  Temp: 98.1 F (36.7 C)  TempSrc: Oral  SpO2: 98%  Weight: 226 lb (102.5 kg)  Height: 6' 2" (1.88 m)   Body mass index is 29.02 kg/m.  Physical Exam  Constitutional: He is oriented to person, place, and time. He appears well-developed and well-nourished.  HENT:  Mouth/Throat: Oropharynx is clear and moist.  MMM; no oral thrush  Eyes: Pupils are equal, round, and reactive to light. Right eye exhibits hordeolum. No scleral icterus.    Neck: Neck supple. Carotid bruit is not present. No thyromegaly present.  Cardiovascular: Normal rate, regular rhythm, normal heart sounds and intact distal pulses.  Exam reveals no gallop and no friction rub.   No murmur heard. No distal LE edema. No calf TTP  Pulmonary/Chest:  Effort normal and breath sounds normal. He has no wheezes. He has no rales. He exhibits no tenderness.  Abdominal: Soft. Normal appearance and bowel sounds are normal. He exhibits no distension, no abdominal bruit, no pulsatile midline mass and no mass. There is no hepatomegaly. There is no tenderness. There is no rigidity, no rebound and no guarding. No  hernia.  Musculoskeletal: He exhibits edema.  Lymphadenopathy:    He has no cervical adenopathy.  Neurological: He is alert and oriented to person, place, and time.  Skin: Skin is warm and dry. No rash noted.  Psychiatric: He has a normal mood and affect. His behavior is normal. Judgment and thought content normal.     Labs reviewed: Appointment on 04/22/2017  Component Date Value Ref Range Status  . Cholesterol 04/22/2017 201* <200 mg/dL Final  . HDL 04/22/2017 45  >40 mg/dL Final  . Triglycerides 04/22/2017 282* <150 mg/dL Final  . LDL Cholesterol (Calc) 04/22/2017 116* mg/dL (calc) Final   Comment: Reference range: <100 . Desirable range <100 mg/dL for primary prevention;   <70 mg/dL for patients with CHD or diabetic patients  with > or = 2 CHD risk factors. Marland Kitchen LDL-C is now calculated using the Martin-Hopkins  calculation, which is a validated novel method providing  better accuracy than the Friedewald equation in the  estimation of LDL-C.  Cresenciano Genre et al. Annamaria Helling. 2248;250(03): 2061-2068  (http://education.[facility].com/faq/FAQ164)   . Total CHOL/HDL Ratio 04/22/2017 4.5  <5.0 (calc) Final  . Non-HDL Cholesterol (Calc) 04/22/2017 156* <130 mg/dL (calc) Final   Comment: For patients with diabetes plus 1 major ASCVD risk  factor, treating to a non-HDL-C goal of <100 mg/dL  (LDL-C of <70 mg/dL) is considered a therapeutic  option.   Marland Kitchen ALT 04/22/2017 28  9 - 46 U/L Final    Dg Chest 2 View  Result Date: 04/06/2017 CLINICAL DATA:  History of tobacco use and asbestos exposure, no current complaints EXAM: CHEST  2 VIEW COMPARISON:  01/16/2017 FINDINGS: Cardiac shadow is stable. Mild aortic calcifications are seen. The lungs are well aerated bilaterally. No focal infiltrate or sizable effusion is seen. Degenerative change of the thoracic spine is noted. IMPRESSION: No active cardiopulmonary disease. Electronically Signed   By: Inez Catalina M.D.   On: 04/06/2017 09:51      Assessment/Plan   ICD-10-CM   1. Mixed hyperlipidemia E78.2 atorvastatin (LIPITOR) 20 MG tablet    CMP with eGFR    Lipid Panel    TSH   improved  2. Hordeolum externum of right upper eyelid H00.011    Improving; followed by eye provider  3. Pulmonary nodules R91.8 CBC with Differential/Platelets   LLL 0.8cm in 07/2015  4. Abnormal findings on diagnostic imaging of lung R91.8 CT Chest Wo Contrast  5. 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 High risk medication use Z79.899 CMP with eGFR  9. Encounter for hepatitis C virus screening test for high risk patient Z11.59 Hep C Antibody   Z91.89    Continue current medications as ordered  Will call with imaging results  Follow up in 6 mos AWV/CPE. Fasting labs prior to appt    [place] S. Perlie Gold  [facility] [address] [place], [place] [address] [phone] Cell (Monday-Friday  8 AM - 5 PM) [phone] After 5 PM and follow prompts

## 2017-04-24 NOTE — Patient Instructions (Signed)
Continue current medications as ordered  Will call with imaging results  Follow up in 6 mos AWV/CPE. Fasting labs prior to appt

## 2017-04-30 ENCOUNTER — Other Ambulatory Visit: Payer: Medicare HMO

## 2017-05-08 ENCOUNTER — Other Ambulatory Visit: Payer: Self-pay | Admitting: Internal Medicine

## 2017-05-08 ENCOUNTER — Ambulatory Visit
Admission: RE | Admit: 2017-05-08 | Discharge: 2017-05-08 | Disposition: A | Discharge status: Home / Self Care | Payer: Medicare HMO | Source: Ambulatory Visit | Attending: Internal Medicine | Admitting: Internal Medicine

## 2017-05-08 DIAGNOSIS — R918 Other nonspecific abnormal finding of lung field: Secondary | ICD-10-CM

## 2017-05-11 ENCOUNTER — Telehealth: Payer: Self-pay | Admitting: *Deleted

## 2017-05-11 DIAGNOSIS — R911 Solitary pulmonary nodule: Secondary | ICD-10-CM

## 2017-05-11 DIAGNOSIS — I7122 Aneurysm of the aortic arch, without rupture: Secondary | ICD-10-CM

## 2017-05-11 DIAGNOSIS — Z8709 Personal history of other diseases of the respiratory system: Secondary | ICD-10-CM

## 2017-05-11 DIAGNOSIS — I712 Thoracic aortic aneurysm, without rupture: Secondary | ICD-10-CM

## 2017-05-11 NOTE — Telephone Encounter (Signed)
Spoke with patient and advised results, letter mailed to patients home address with results.   Also pt is asking if his rx for viagra was sent to [facility]? Per pt he has filled out paperwork and bought it back in.

## 2017-05-11 NOTE — Telephone Encounter (Signed)
I have not seen any paperwork for Viagra. Please check the office

## 2017-05-11 NOTE — Telephone Encounter (Signed)
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

## 2017-05-12 NOTE — Telephone Encounter (Signed)
Phoned in refill of viagra to [facility].   Spoke with patient and advised results

## 2017-05-18 ENCOUNTER — Telehealth: Payer: Self-pay | Admitting: *Deleted

## 2017-05-18 NOTE — Telephone Encounter (Signed)
Called patient to inform him that his medication had arrived to the office and was ready for pick up.(Viagra 100 mg).

## 2017-05-20 ENCOUNTER — Encounter: Payer: Self-pay | Admitting: Pulmonary Disease

## 2017-05-20 ENCOUNTER — Ambulatory Visit (INDEPENDENT_AMBULATORY_CARE_PROVIDER_SITE_OTHER): Payer: Medicare HMO | Admitting: Pulmonary Disease

## 2017-05-20 DIAGNOSIS — I7122 Aneurysm of the aortic arch, without rupture: Secondary | ICD-10-CM | POA: Insufficient documentation

## 2017-05-20 DIAGNOSIS — R918 Other nonspecific abnormal finding of lung field: Secondary | ICD-10-CM

## 2017-05-20 DIAGNOSIS — I712 Thoracic aortic aneurysm, without rupture: Secondary | ICD-10-CM | POA: Diagnosis not present

## 2017-05-20 DIAGNOSIS — R911 Solitary pulmonary nodule: Secondary | ICD-10-CM | POA: Diagnosis not present

## 2017-05-20 NOTE — Progress Notes (Deleted)
   Subjective:    Patient ID: Theodore Johnson, male    DOB: 08/18/1946, 71 y.o.   MRN: [id number]  HPI    Review of Systems     Objective:   Physical Exam        Assessment & Plan:

## 2017-05-20 NOTE — Assessment & Plan Note (Signed)
Schedule CT chest without contrast in 6 months to follow-up on nodule. If you're able to obtain your records from [place], please provide [place] with a copy of your CT scan of your chest results-he states that he has been told about a nodule in the past and if this is indeed the same nodule then this is likely benign

## 2017-05-20 NOTE — Progress Notes (Signed)
Subjective:    Patient ID: Theodore Johnson, male    DOB: 1946-05-25, 71 y.o.   MRN: [id number]  HPI  71 year old remote smoker presents for evaluation of pulmonary nodule noted on CT imaging. 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 He lived in [place] then and had imaging studies done 20 years ago and then again 2 years ago and both times was told that he had a nodule in his lung -these records are not available to me at present to review. 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 CT chest without contrast was performed which showed a 7 mm peripheral nodule in the left lower lobe, descending aortic arch was mildly enlarged at 3.4 cm, mild emphysematous blebs were noted in the apices of the lungs There is no evidence of fibrosis of pleural calcification or mediastinal lymphadenopathy.  He denies shortness of breath, wheezing, cough or hemoptysis. He denies fevers, weight loss or loss of appetite.  He quit smoking in 1992, smoked less than 10 pack years     Past Medical History:  Diagnosis Date  . Cystitis   . Prostatitis, acute   . Solitary pulmonary nodule    Past Surgical History:  Procedure Laterality Date  . COLONOSCOPY  08/19/2007    No Known Allergies   Social History   Social History  . Marital status: Divorced    Spouse name: N/A  . Number of children: N/A  . Years of education: N/A   Occupational History  . Not on file.   Social History Main Topics  . Smoking status: Former Games developer  . Smokeless tobacco: Never Used     Comment: Quit 37 years ago as of 08/06/16  . Alcohol use Yes  . Drug use: Yes    Types: Marijuana   Comment: 2-3 times per week   . Sexual activity: Yes   Other Topics Concern  . Not on file   Social History Narrative   Diet? N/A      Do you drink/eat things with caffeine? no      Marital status?      divorced                              What year were you married? N/A      Do you live in a house, apartment, assisted living, condo, trailer, etc.? apartment      Is it one or more stories? no      How many persons live in your home? 1      Do you have any pets in your home? (please list) no      Current or past profession: welder      Do you exercise?           yes                           Type & how often?      Do you have a living will? no      Do you have a DNR form?  yes  If not, do you want to discuss one?      Do you have signed POA/HPOA for forms?  no       Family History  Problem Relation Age of Onset  . Hypertension Mother   . COPD Father   . Aneurysm Sister      Review of Systems Constitutional: negative for anorexia, fevers and sweats  Eyes: negative for irritation, redness and visual disturbance  Ears, nose, mouth, throat, and face: negative for earaches, epistaxis, nasal congestion and sore throat  Respiratory: negative for cough, dyspnea on exertion, sputum and wheezing  Cardiovascular: negative for chest pain, dyspnea, lower extremity edema, orthopnea, palpitations and syncope  Gastrointestinal: negative for abdominal pain, constipation, diarrhea, melena, nausea and vomiting  Genitourinary:negative for dysuria, frequency and hematuria  Hematologic/lymphatic: negative for bleeding, easy bruising and lymphadenopathy  Musculoskeletal:negative for arthralgias, muscle weakness and stiff joints  Neurological: negative for coordination problems, gait problems, headaches and weakness  Endocrine: negative for diabetic symptoms including polydipsia, polyuria and weight loss     Objective:   Physical Exam  Gen. Pleasant,  well-nourished, in no distress, normal affect ENT - no lesions, no post nasal drip Neck: No JVD, no thyromegaly, no carotid bruits Lungs: no use of accessory muscles, no dullness to percussion, clear without rales or rhonchi  Cardiovascular: Rhythm regular, heart sounds  normal, no murmurs or gallops, no peripheral edema Abdomen: soft and non-tender, no hepatosplenomegaly, BS normal. Musculoskeletal: No deformities, no cyanosis or clubbing Neuro:  alert, non focal       Assessment & Plan:

## 2017-05-20 NOTE — Patient Instructions (Signed)
Schedule CT chest without contrast in 6 months to follow-up on nodule. If you're able to obtain your records from [place], please provide [place] with a copy of your CT scan of your chest results

## 2017-05-20 NOTE — Assessment & Plan Note (Signed)
Will need annual follow-up with CT contrast, if enlarging only then referral to thoracic surgery

## 2017-06-05 ENCOUNTER — Encounter: Payer: Medicare HMO | Admitting: Vascular Surgery

## 2017-06-26 ENCOUNTER — Encounter: Payer: Medicare HMO | Admitting: Vascular Surgery

## 2017-08-21 ENCOUNTER — Telehealth: Payer: Self-pay

## 2017-08-21 NOTE — Telephone Encounter (Signed)
Patient stopped by the office to request a reorder on his viagra 100 mg through [employer] patient assistance.  I called [employer] patient assistance at [phone], after several attempts to get passed the automated system with out having patient's ID number for [employer] I ended call.  I called patient to see if he knew his ID number with [employer], patient was unable to locate information. Patient states he will contact [employer] and let them know we are trying to contact them.   Patient returned call and provided his ID number as [id number]    [facility] [employer] patient [facility], spoke with Misty StanleyLisa, our office needs to fax a new rx sent to [phone]  End of enrollment 02/2018  Dr.Carter please advise for medication is not on list

## 2017-08-24 MED ORDER — SILDENAFIL CITRATE 100 MG PO TABS: 100.0000 mg | ORAL_TABLET | Freq: Every day | ORAL | 3 refills | Status: AC | PRN

## 2017-08-24 NOTE — Telephone Encounter (Signed)
RX faxed to patient assistance.   

## 2017-08-24 NOTE — Telephone Encounter (Signed)
Ok for Viagra 100mg  #18 take 1 tab po daily prn erectile dysfunction RF x 3

## 2017-08-27 ENCOUNTER — Telehealth: Payer: Self-pay | Admitting: *Deleted

## 2017-08-27 NOTE — Telephone Encounter (Signed)
Received fax from [employer] stating that patient has been enrolled in [employer] Patient Assistance Program until March 10, 2018 for Viagra.   Patient ID#: [id number] ReOrder #: [id number] [email or web address]

## 2017-08-31 ENCOUNTER — Telehealth: Payer: Self-pay

## 2017-08-31 NOTE — Telephone Encounter (Signed)
I spoke with patient to let him know that a bottle of viagra 100 mg tablets arrived via mail. Pt stated that he would be by the office this week to pick up medication.    Medication was placed in cabinet at front desk.

## 2017-10-21 ENCOUNTER — Encounter: Payer: Self-pay | Admitting: *Deleted

## 2017-10-23 ENCOUNTER — Other Ambulatory Visit: Payer: Medicare HMO

## 2017-10-27 ENCOUNTER — Ambulatory Visit: Payer: Medicare HMO

## 2017-10-27 ENCOUNTER — Encounter: Payer: Medicare HMO | Admitting: Internal Medicine

## 2017-11-18 ENCOUNTER — Other Ambulatory Visit: Payer: Medicare HMO

## 2017-11-20 ENCOUNTER — Encounter: Payer: Self-pay | Admitting: Internal Medicine

## 2017-11-20 ENCOUNTER — Ambulatory Visit: Payer: Self-pay

## 2017-11-23 ENCOUNTER — Inpatient Hospital Stay: Admission: RE | Admit: 2017-11-23 | Payer: Medicare HMO | Source: Ambulatory Visit

## 2017-11-24 ENCOUNTER — Ambulatory Visit: Payer: Medicare HMO | Admitting: Adult Health

## 2017-12-07 ENCOUNTER — Inpatient Hospital Stay: Admission: RE | Admit: 2017-12-07 | Payer: Medicare HMO | Source: Ambulatory Visit

## 2017-12-08 ENCOUNTER — Ambulatory Visit: Payer: Medicare HMO

## 2017-12-09 ENCOUNTER — Encounter: Payer: Medicare HMO | Admitting: Internal Medicine

## 2018-01-06 ENCOUNTER — Telehealth: Payer: Self-pay | Admitting: Internal Medicine

## 2018-01-06 NOTE — Telephone Encounter (Signed)
Left msg with a male asking her to have the pt call and schedule AWV/EV appt. VDM (DD)

## 2018-01-15 NOTE — Telephone Encounter (Signed)
01/15/18 Left msg asking pt to confirm this AWV/CPE appt. VDM (DD)

## 2018-02-08 ENCOUNTER — Telehealth: Payer: Self-pay | Admitting: Internal Medicine

## 2018-02-08 NOTE — Telephone Encounter (Signed)
Left msg asking pt to confirm this AWV/CPE appt. Last AWV 10/17/16. VDM (DD)

## 2018-02-16 NOTE — Telephone Encounter (Signed)
Left another msg asking pt to confirm this appt. VDM (DD)

## 2018-02-22 NOTE — Telephone Encounter (Signed)
Left msg asking pt to confirm this rescheduled AWV w/ Huntley DecSara.

## 2018-03-11 ENCOUNTER — Telehealth: Payer: Self-pay | Admitting: Internal Medicine

## 2018-03-11 NOTE — Telephone Encounter (Signed)
Noted, I removed Dr.Carter's name off patient's chart

## 2018-03-11 NOTE — Telephone Encounter (Signed)
I called the pt to schedule AWV/CPE.  He said that he has moved back to [place] and wanted to thank Dr. Montez Moritaarter for everything.  I told him to let us know when he gets a new PCP there so that his records can be transferred if requested. VDM (DD)

## 2018-03-15 ENCOUNTER — Ambulatory Visit: Payer: Medicare HMO

## 2018-03-19 ENCOUNTER — Encounter: Payer: Medicare HMO | Admitting: Internal Medicine

## 2018-03-19 ENCOUNTER — Ambulatory Visit: Payer: Medicare HMO

## 2018-11-14 ENCOUNTER — Encounter (HOSPITAL_COMMUNITY): Payer: Self-pay | Admitting: Emergency Medicine

## 2018-11-14 ENCOUNTER — Emergency Department (HOSPITAL_COMMUNITY): Payer: Medicare HMO

## 2018-11-14 ENCOUNTER — Emergency Department (HOSPITAL_COMMUNITY)
Admission: EM | Admit: 2018-11-14 | Discharge: 2018-11-14 | Disposition: A | Discharge status: Home / Self Care | Payer: Medicare HMO | Attending: Emergency Medicine | Admitting: Emergency Medicine

## 2018-11-14 ENCOUNTER — Other Ambulatory Visit: Payer: Self-pay

## 2018-11-14 DIAGNOSIS — Z79899 Other long term (current) drug therapy: Secondary | ICD-10-CM | POA: Diagnosis not present

## 2018-11-14 DIAGNOSIS — K859 Acute pancreatitis without necrosis or infection, unspecified: Secondary | ICD-10-CM | POA: Insufficient documentation

## 2018-11-14 DIAGNOSIS — Z87891 Personal history of nicotine dependence: Secondary | ICD-10-CM | POA: Diagnosis not present

## 2018-11-14 DIAGNOSIS — R1084 Generalized abdominal pain: Secondary | ICD-10-CM | POA: Diagnosis present

## 2018-11-14 LAB — CBC WITH DIFFERENTIAL/PLATELET
Abs Immature Granulocytes: 0.02 10*3/uL (ref 0.00–0.07)
BASOS PCT: 1 %
Basophils Absolute: 0 10*3/uL (ref 0.0–0.1)
EOS ABS: 0.1 10*3/uL (ref 0.0–0.5)
Eosinophils Relative: 2 %
HEMATOCRIT: 43.3 % (ref 39.0–52.0)
Hemoglobin: 14.3 g/dL (ref 13.0–17.0)
IMMATURE GRANULOCYTES: 0 %
LYMPHS ABS: 3 10*3/uL (ref 0.7–4.0)
Lymphocytes Relative: 47 %
MCH: 28.9 pg (ref 26.0–34.0)
MCHC: 33 g/dL (ref 30.0–36.0)
MCV: 87.7 fL (ref 80.0–100.0)
Monocytes Absolute: 0.9 10*3/uL (ref 0.1–1.0)
Monocytes Relative: 13 %
NEUTROS ABS: 2.3 10*3/uL (ref 1.7–7.7)
NEUTROS PCT: 37 %
NRBC: 0 % (ref 0.0–0.2)
PLATELETS: 223 10*3/uL (ref 150–400)
RBC: 4.94 MIL/uL (ref 4.22–5.81)
RDW: 14.4 % (ref 11.5–15.5)
WBC: 6.3 10*3/uL (ref 4.0–10.5)

## 2018-11-14 LAB — COMPREHENSIVE METABOLIC PANEL
ALT: 82 U/L — ABNORMAL HIGH (ref 0–44)
AST: 67 U/L — AB (ref 15–41)
Albumin: 3.6 g/dL (ref 3.5–5.0)
Alkaline Phosphatase: 74 U/L (ref 38–126)
Anion gap: 9 (ref 5–15)
BUN: 9 mg/dL (ref 8–23)
CHLORIDE: 103 mmol/L (ref 98–111)
CO2: 26 mmol/L (ref 22–32)
Calcium: 9.6 mg/dL (ref 8.9–10.3)
Creatinine, Ser: 1.13 mg/dL (ref 0.61–1.24)
GFR calc Af Amer: >60 mL/min (ref >60)
GFR calc non Af Amer: >60 mL/min (ref >60)
GLUCOSE: 105 mg/dL — AB (ref 70–99)
POTASSIUM: 3.5 mmol/L (ref 3.5–5.1)
Sodium: 138 mmol/L (ref 135–145)
Total Bilirubin: 0.6 mg/dL (ref 0.3–1.2)
Total Protein: 7.2 g/dL (ref 6.5–8.1)

## 2018-11-14 LAB — URINALYSIS, ROUTINE W REFLEX MICROSCOPIC
Bilirubin Urine: NEGATIVE
GLUCOSE, UA: NEGATIVE mg/dL
Hgb urine dipstick: NEGATIVE
Ketones, ur: NEGATIVE mg/dL
LEUKOCYTE UA: NEGATIVE
Nitrite: NEGATIVE
PH: 7 (ref 5.0–8.0)
PROTEIN: NEGATIVE mg/dL
Specific Gravity, Urine: 1.019 (ref 1.005–1.030)

## 2018-11-14 LAB — LIPASE, BLOOD: Lipase: 106 U/L — ABNORMAL HIGH (ref 11–51)

## 2018-11-14 LAB — TROPONIN I: Troponin I: <0.03 ng/mL (ref <0.03)

## 2018-11-14 MED ORDER — ALUM & MAG HYDROXIDE-SIMETH 200-200-20 MG/5ML PO SUSP
30.0000 mL | Freq: Once | ORAL | Status: AC
  Administered 2018-11-14: 30 mL via ORAL
  Filled 2018-11-14: qty 30

## 2018-11-14 MED ORDER — TRAMADOL HCL 50 MG PO TABS: 50.0000 mg | ORAL_TABLET | Freq: Two times a day (BID) | ORAL | 0 refills | Status: AC | PRN

## 2018-11-14 MED ORDER — LIDOCAINE VISCOUS HCL 2 % MT SOLN
15.0000 mL | Freq: Once | OROMUCOSAL | Status: AC
  Administered 2018-11-14: 15 mL via ORAL
  Filled 2018-11-14: qty 15

## 2018-11-14 MED ORDER — ONDANSETRON 4 MG PO TBDP
8.0000 mg | ORAL_TABLET | Freq: Once | ORAL | Status: AC
  Administered 2018-11-14: 8 mg via ORAL
  Filled 2018-11-14: qty 2

## 2018-11-14 MED ORDER — KETOROLAC TROMETHAMINE 15 MG/ML IJ SOLN
15.0000 mg | Freq: Once | INTRAMUSCULAR | Status: AC
  Administered 2018-11-14: 15 mg via INTRAVENOUS
  Filled 2018-11-14: qty 1

## 2018-11-14 MED ORDER — SIMETHICONE 80 MG PO CHEW
40.0000 mg | CHEWABLE_TABLET | Freq: Once | ORAL | Status: AC
  Administered 2018-11-14: 40 mg via ORAL
  Filled 2018-11-14 (×2): qty 1

## 2018-11-14 MED ORDER — DICYCLOMINE HCL 10 MG/ML IM SOLN
20.0000 mg | Freq: Once | INTRAMUSCULAR | Status: AC
  Administered 2018-11-14: 20 mg via INTRAMUSCULAR
  Filled 2018-11-14: qty 2

## 2018-11-14 NOTE — Discharge Instructions (Addendum)
Do not drink alcohol, drive a car or operate heavy machinery while taking pain medication.

## 2018-11-14 NOTE — ED Triage Notes (Signed)
Pt coming POV with abd pain that started yesterday afternoon. Some nausea noted but no V/D. Pt took some Pepto last night. Last BM tonight, no blood noted.

## 2018-11-14 NOTE — ED Notes (Signed)
Awaiting Mylicon from Pharmacy. Note sent

## 2018-11-14 NOTE — ED Provider Notes (Signed)
[facility] EMERGENCY DEPARTMENT Provider Note   CSN: [id number] Arrival date & time: 11/14/18  0220    History   Chief Complaint Chief Complaint  Patient presents with   Abdominal Pain    HPI [place] is a 73 y.o. male.     The history is provided by the patient.  Abdominal Pain  Pain location:  Generalized Pain quality: aching   Pain radiates to:  Does not radiate Pain severity:  Severe Onset quality:  Gradual Duration:  2 days Timing:  Constant Progression:  Unchanged Chronicity:  New Context: not diet changes, not eating, not laxative use, not suspicious food intake and not trauma   Relieved by:  Nothing Worsened by:  Nothing Ineffective treatments: peptobismol. 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 Authorizing Provider  atorvastatin (LIPITOR) 20 MG tablet Take 1 tablet (20 mg total) by mouth at bedtime. 04/24/17    Kirt Boys, DO  sildenafil (VIAGRA) 100 MG tablet Take 1 tablet (100 mg total) by mouth daily as needed for erectile dysfunction. 08/24/17   Kirt Boys, DO    Family History Family History  Problem Relation Age of Onset   Hypertension Mother    COPD Father    Aneurysm Sister     Social History Social History   Tobacco Use   Smoking status: Former Smoker   Smokeless tobacco: Never Used   Tobacco comment: Quit 37 years ago as of 08/06/16  Substance Use Topics   Alcohol use: Yes   Drug use: Yes    Types: Marijuana    Comment: 2-3 times per week      Allergies   Patient has no known allergies.   Review of Systems Review of Systems  Constitutional: Negative for appetite change and fever.  HENT: Negative for sore throat and voice change.   Respiratory: Negative for cough and shortness of breath.   Cardiovascular: Negative for chest pain.  Gastrointestinal: Positive for abdominal pain and nausea. Negative for anorexia, constipation, diarrhea, flatus and vomiting.  Genitourinary: Negative for difficulty urinating, flank pain and hematuria.  All other systems reviewed and are negative.    Physical Exam Updated Vital Signs BP (!) 156/80    Pulse 67    Temp 98.3 F (36.8 C) (Oral)    Resp 18    Ht 6\' 2"  (1.88 m)    Wt 99.8 kg    SpO2 96%    BMI 28.25  kg/m   Physical Exam Vitals signs and nursing note reviewed.  Constitutional:      General: He is not in acute distress.    Appearance: He is normal weight.  HENT:     Head: Normocephalic and atraumatic.     Nose: Nose normal.  Eyes:     Extraocular Movements: Extraocular movements intact.     Pupils: Pupils are equal, round, and reactive to light.  Neck:     Musculoskeletal: Normal range of motion and neck supple.  Cardiovascular:     Rate and Rhythm: Normal rate and regular rhythm.     Pulses: Normal pulses.     Heart sounds: Normal heart sounds.  Pulmonary:     Effort: Pulmonary effort is normal.  No respiratory distress.     Breath sounds: No wheezing.  Abdominal:     General: Abdomen is flat.     Tenderness: There is no abdominal tenderness. There is no guarding or rebound. Negative signs include Murphy's sign, Rovsing's sign and McBurney's sign.     Hernia: No hernia is present.     Comments: gassy  Musculoskeletal: Normal range of motion.  Skin:    General: Skin is warm and dry.     Capillary Refill: Capillary refill takes less than 2 seconds.  Neurological:     General: No focal deficit present.     Mental Status: He is alert and oriented to person, place, and time.  Psychiatric:        Mood and Affect: Mood normal.        Behavior: Behavior normal.      ED Treatments / Results  Labs (all labs ordered are listed, but only abnormal results are displayed) Results for orders placed or performed during the hospital encounter of 11/14/18  CBC with Differential/Platelet  Result Value Ref Range   WBC 6.3 4.0 - 10.5 K/uL   RBC 4.94 4.22 - 5.81 MIL/uL   Hemoglobin 14.3 13.0 - 17.0 g/dL   HCT 95.2 84.1 - 32.4 %   MCV 87.7 80.0 - 100.0 fL   MCH 28.9 26.0 - 34.0 pg   MCHC 33.0 30.0 - 36.0 g/dL   RDW 40.1 02.7 - 25.3 %   Platelets 223 150 - 400 K/uL   nRBC 0.0 0.0 - 0.2 %   Neutrophils Relative % 37 %   Neutro Abs 2.3 1.7 - 7.7 K/uL   Lymphocytes Relative 47 %   Lymphs Abs 3.0 0.7 - 4.0 K/uL   Monocytes Relative 13 %   Monocytes Absolute 0.9 0.1 - 1.0 K/uL   Eosinophils Relative 2 %   Eosinophils Absolute 0.1 0.0 - 0.5 K/uL   Basophils Relative 1 %   Basophils Absolute 0.0 0.0 - 0.1 K/uL   Immature Granulocytes 0 %   Abs Immature Granulocytes 0.02 0.00 - 0.07 K/uL  Comprehensive metabolic panel  Result Value Ref Range   Sodium 138 135 - 145 mmol/L   Potassium 3.5 3.5 - 5.1 mmol/L   Chloride 103 98 - 111 mmol/L   CO2 26 22 - 32 mmol/L   Glucose, Bld 105 (H) 70 - 99 mg/dL   BUN 9 8 - 23 mg/dL   Creatinine, Ser 6.64 0.61 - 1.24 mg/dL   Calcium 9.6 8.9 - 40.3  mg/dL   Total Protein 7.2 6.5 - 8.1 g/dL   Albumin 3.6 3.5 - 5.0 g/dL   AST 67 (H) 15 - 41 U/L   ALT 82 (H) 0 - 44 U/L  Alkaline Phosphatase 74 38 - 126 U/L   Total Bilirubin 0.6 0.3 - 1.2 mg/dL   GFR calc non Af Amer >60 >60 mL/min   GFR calc Af Amer >60 >60 mL/min   Anion gap 9 5 - 15  Lipase, blood  Result Value Ref Range   Lipase 106 (H) 11 - 51 U/L  Urinalysis, Routine w reflex microscopic  Result Value Ref Range   Color, Urine YELLOW YELLOW   APPearance CLEAR CLEAR   Specific Gravity, Urine 1.019 1.005 - 1.030   pH 7.0 5.0 - 8.0   Glucose, UA NEGATIVE NEGATIVE mg/dL   Hgb urine dipstick NEGATIVE NEGATIVE   Bilirubin Urine NEGATIVE NEGATIVE   Ketones, ur NEGATIVE NEGATIVE mg/dL   Protein, ur NEGATIVE NEGATIVE mg/dL   Nitrite NEGATIVE NEGATIVE   Leukocytes,Ua NEGATIVE NEGATIVE  Troponin I - ONCE - STAT  Result Value Ref Range   Troponin I <0.03 <0.03 ng/mL   Dg Abdomen 1 View  Result Date: 11/14/2018 CLINICAL DATA:  Right upper quadrant abdominal pain EXAM: ABDOMEN - 1 VIEW COMPARISON:  None. FINDINGS: Nonobstructive bowel gas pattern. Vague calcification along the right aspect of the T12 vertebral body favors costochondral cartilage. Mild degenerative changes of the lumbar spine. Visualized bony pelvis appears intact. IMPRESSION: Negative. Electronically Signed   By: Charline Bills M.D.   On: 11/14/2018 04:41   US Abdomen Limited  Result Date: 11/14/2018 CLINICAL DATA:  Elevated LFTs, right upper quadrant pain x1 day EXAM: ULTRASOUND ABDOMEN LIMITED RIGHT UPPER QUADRANT COMPARISON:  Partial comparison to CT chest dated 05/08/2017 FINDINGS: Gallbladder: 9 mm gallstone with gallbladder sludge. No gallbladder wall thickening or pericholecystic fluid. Negative sonographic Murphy's sign. Common bile duct: Diameter: 5 mm Liver: No focal lesion identified. Within normal limits in parenchymal echogenicity. Mildly nodular hepatic contour. Portal vein is patent on color Doppler  imaging with normal direction of blood flow towards the liver. IMPRESSION: Cholelithiasis, without associated inflammatory changes to suggest acute cholecystitis. Electronically Signed   By: Charline Bills M.D.   On: 11/14/2018 04:44    EKG EKG Interpretation  Date/Time:  Sunday November 14 2018 02:30:32 EDT Ventricular Rate:  81 PR Interval:    QRS Duration: 81 QT Interval:  366 QTC Calculation: 425 R Axis:   38 Text Interpretation:  Sinus rhythm Confirmed by Nicanor Alcon, Shyan Scalisi ([id number]) on 11/14/2018 3:05:52 AM   Radiology Dg Abdomen 1 View  Result Date: 11/14/2018 CLINICAL DATA:  Right upper quadrant abdominal pain EXAM: ABDOMEN - 1 VIEW COMPARISON:  None. FINDINGS: Nonobstructive bowel gas pattern. Vague calcification along the right aspect of the T12 vertebral body favors costochondral cartilage. Mild degenerative changes of the lumbar spine. Visualized bony pelvis appears intact. IMPRESSION: Negative. Electronically Signed   By: Charline Bills M.D.   On: 11/14/2018 04:41   US Abdomen Limited  Result Date: 11/14/2018 CLINICAL DATA:  Elevated LFTs, right upper quadrant pain x1 day EXAM: ULTRASOUND ABDOMEN LIMITED RIGHT UPPER QUADRANT COMPARISON:  Partial comparison to CT chest dated 05/08/2017 FINDINGS: Gallbladder: 9 mm gallstone with gallbladder sludge. No gallbladder wall thickening or pericholecystic fluid. Negative sonographic Murphy's sign. Common bile duct: Diameter: 5 mm Liver: No focal lesion identified. Within normal limits in parenchymal echogenicity. Mildly nodular hepatic contour. Portal vein is patent on color Doppler imaging with normal direction of blood flow towards the liver. IMPRESSION: Cholelithiasis, without associated inflammatory changes to suggest acute cholecystitis. Electronically Signed   By: Charline Bills M.D.   On: 11/14/2018 04:44    Procedures  Procedures (including critical care time)  Medications Ordered in ED Medications  ketorolac (TORADOL) 15  MG/ML injection 15 mg (has no administration in time range)  simethicone (MYLICON) chewable tablet 40 mg (40 mg Oral Given 11/14/18 0454)  alum & mag hydroxide-simeth (MAALOX/MYLANTA) 200-200-20 MG/5ML suspension 30 mL (30 mLs Oral Given 11/14/18 0310)    And  lidocaine (XYLOCAINE) 2 % viscous mouth solution 15 mL (15 mLs Oral Given 11/14/18 0310)  dicyclomine (BENTYL) injection 20 mg (20 mg Intramuscular Given 11/14/18 0318)  ondansetron (ZOFRAN-ODT) disintegrating tablet 8 mg (8 mg Oral Given 11/14/18 0454)     Have advised against greasy fatty food and absolutely no alcohol.  Exam and imaging is not consistent with cholecystitis.  Have prescribed a short course of pain meds and have patient follow up with PMD.   Final Clinical Impressions(s) / ED Diagnoses   Return for intractable cough, coughing up blood,fevers >100.4 unrelieved by medication, shortness of breath, intractable vomiting, chest pain, shortness of breath, weakness,numbness, changes in speech, facial asymmetry,abdominal pain, passing out,Inability to tolerate liquids or food, cough, altered mental status or any concerns. No signs of systemic illness or infection. The patient is nontoxic-appearing on exam and vital signs are within normal limits.   I have reviewed the triage vital signs and the nursing notes. Pertinent labs &imaging results that were available during my care of the patient were reviewed by me and considered in my medical decision making (see chart for details).  After history, exam, and medical workup I feel the patient has been appropriately medically screened and is safe for discharge home. Pertinent diagnoses were discussed with the patient. Patient was given return precautions.   Julious Langlois, MD 11/14/18 1610

## 2018-11-14 NOTE — ED Notes (Signed)
Patient verbalizes understanding of discharge instructions. Opportunity for questioning and answers were provided. Armband removed by staff, pt discharged from ED ambulatory.   

## 2018-11-14 NOTE — ED Notes (Signed)
Pt transported to xray 

## 2019-04-26 IMAGING — DX DG CHEST 2V
2 series · 2 of 2 positions shown · non-contrast
Comparison: 01/16/2017

CLINICAL DATA: History of tobacco use and asbestos exposure, no
current complaints

EXAM:
CHEST  2 VIEW

[dg chest 2 view (1 of 2)]
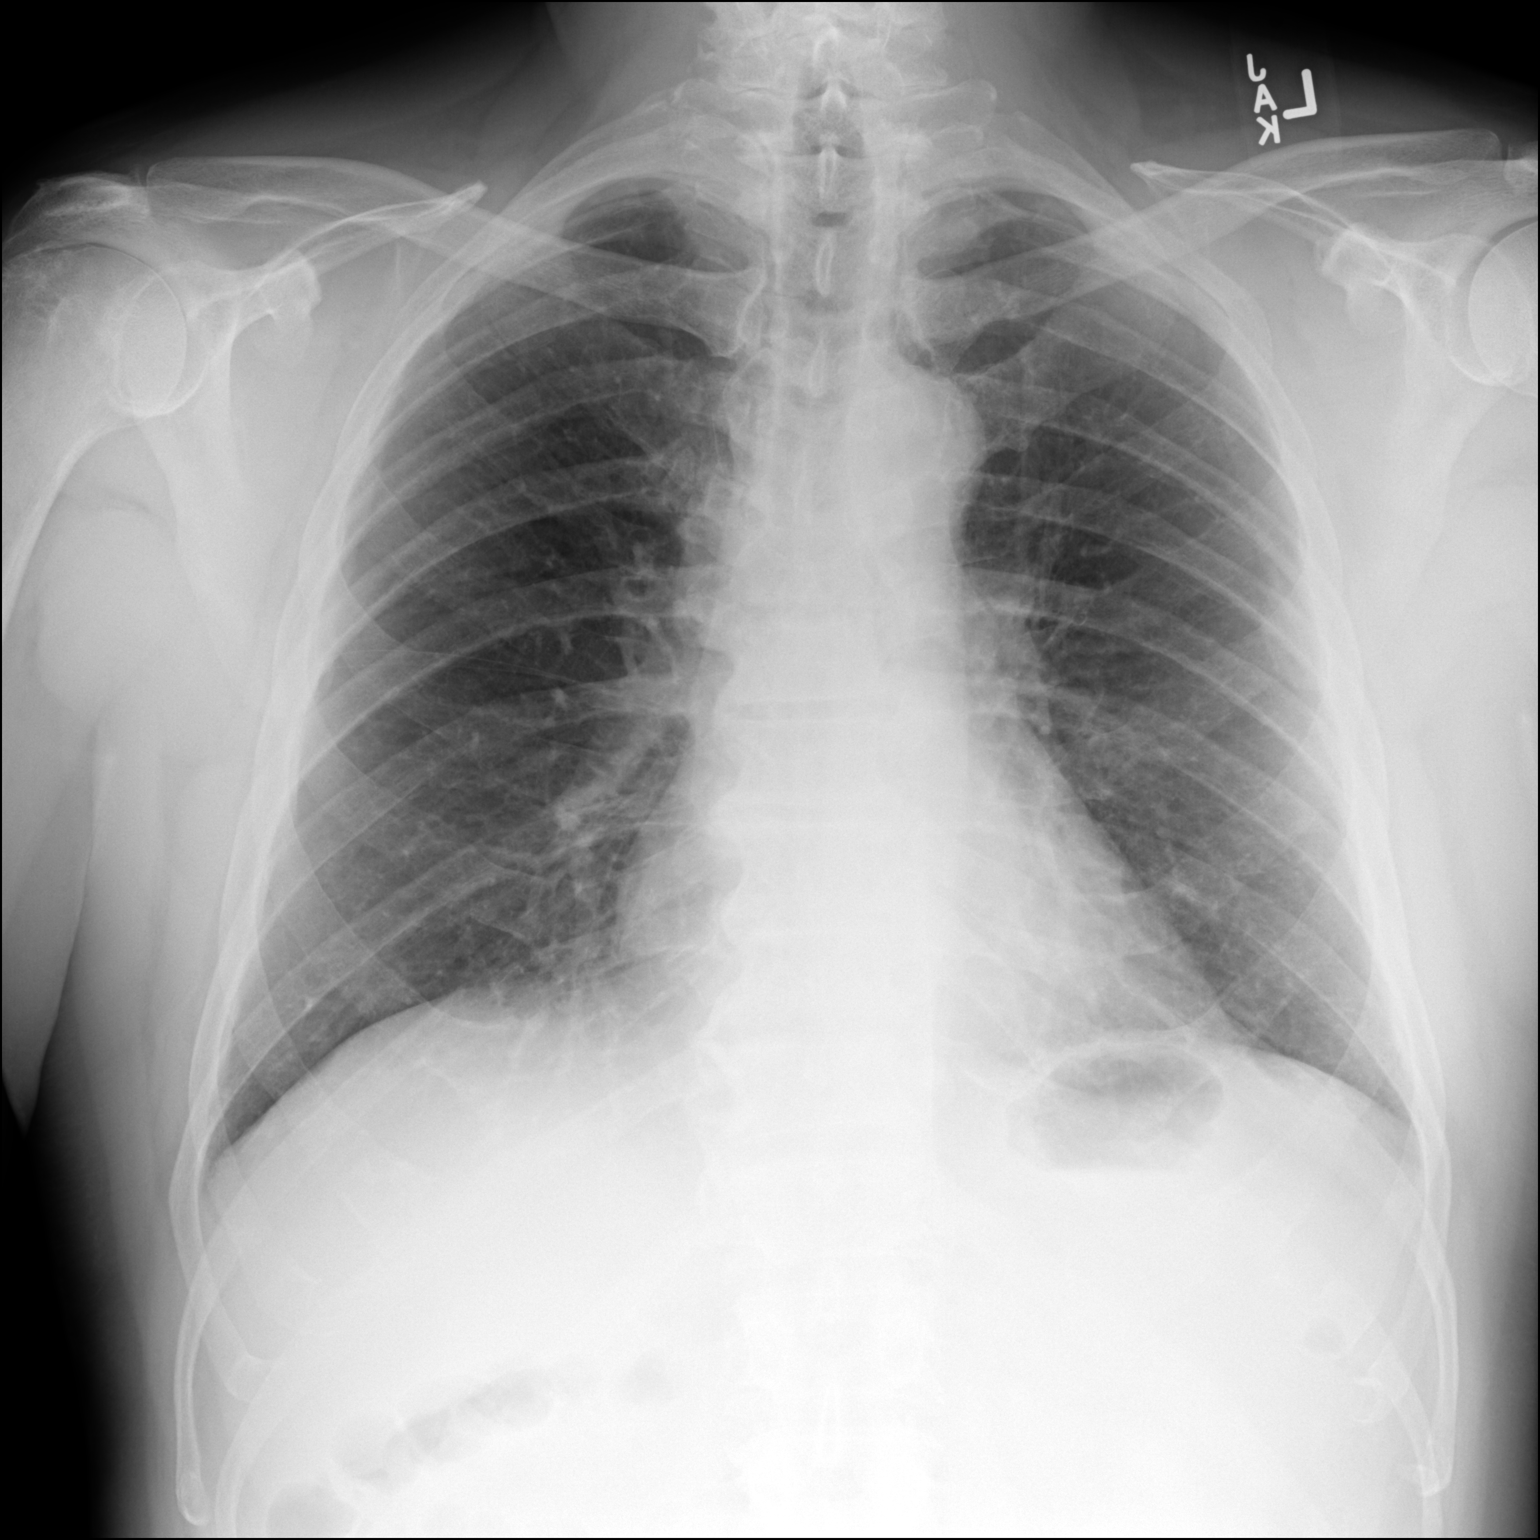

[dg chest 2 view (2 of 2)]
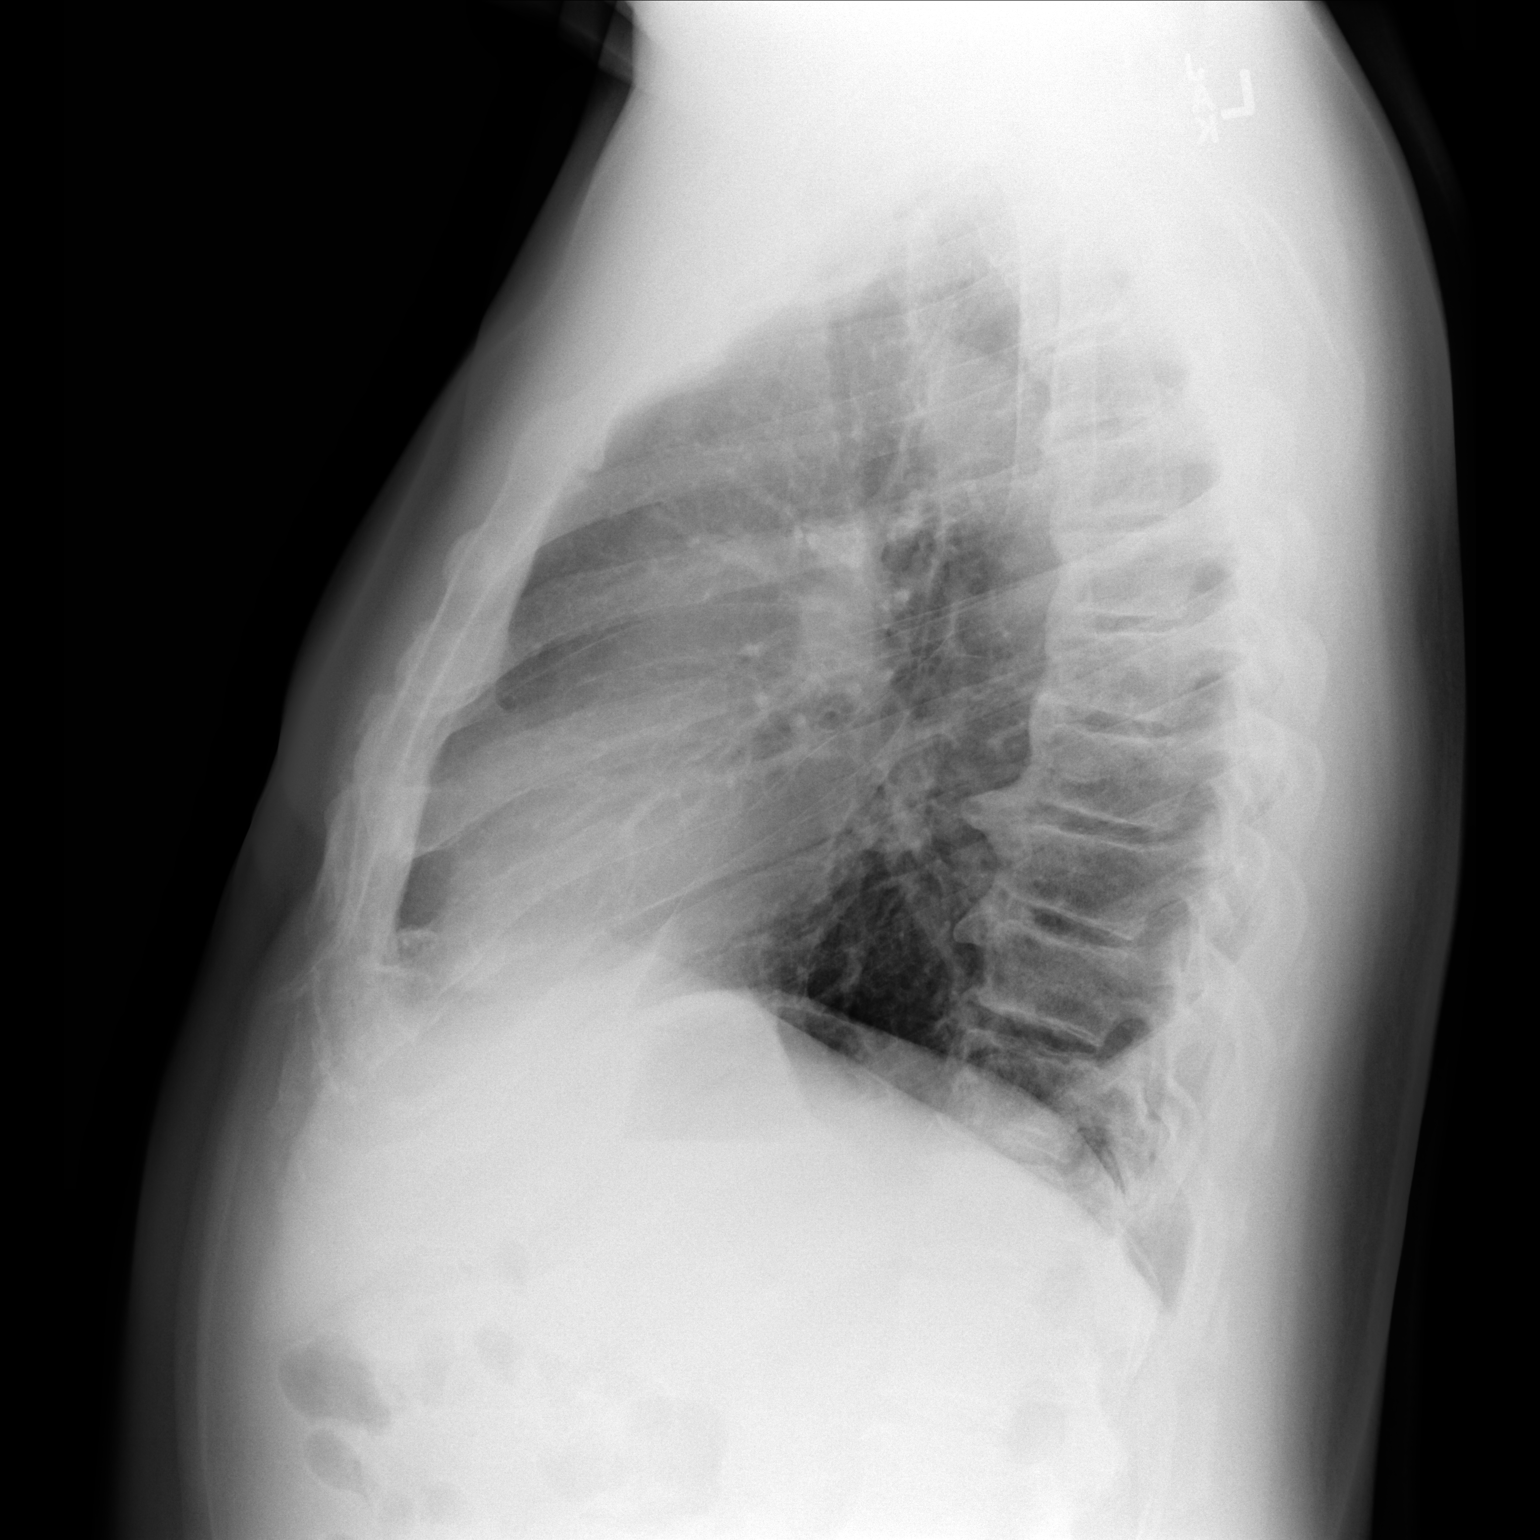

[2 of 2 positions shown; findings below may reference images not displayed]

FINDINGS: Cardiac shadow is stable. Mild aortic calcifications are seen. The
lungs are well aerated bilaterally. No focal infiltrate or sizable
effusion is seen. Degenerative change of the thoracic spine is
noted.
IMPRESSION: No active cardiopulmonary disease.

## 2019-11-25 ENCOUNTER — Ambulatory Visit: Payer: Medicare Other | Attending: Internal Medicine

## 2019-11-25 DIAGNOSIS — Z23 Encounter for immunization: Secondary | ICD-10-CM

## 2019-11-25 NOTE — Progress Notes (Signed)
   Covid-19 Vaccination Clinic  Name:  [place]    MRN: [id number] DOB: 27-Jun-1946  11/25/2019  [name] was observed post Covid-19 immunization for 15 minutes without incident. He was provided with Vaccine Information Sheet and instruction to access the V-Safe system.   [name] was instructed to call 911 with any severe reactions post vaccine: Marland Kitchen Difficulty breathing  . Swelling of face and throat  . A fast heartbeat  . A bad rash all over body  . Dizziness and weakness   Immunizations Administered    Name Date Dose VIS Date Route   Pfizer COVID-19 Vaccine 11/25/2019 10:03 AM 0.3 mL 07/29/2019 Intramuscular   Manufacturer: [employer], [employer]   Lot: [id number]   NDC: [address]-5

## 2019-12-20 ENCOUNTER — Ambulatory Visit: Payer: Medicare Other | Attending: Internal Medicine

## 2019-12-20 DIAGNOSIS — Z23 Encounter for immunization: Secondary | ICD-10-CM

## 2019-12-20 NOTE — Progress Notes (Signed)
   Covid-19 Vaccination Clinic  Name:  KALE RONDEAU    MRN: [id number] DOB: Jun 01, 1946  12/20/2019  Mr. Waggle was observed post Covid-19 immunization for 15 minutes without incident. He was provided with Vaccine Information Sheet and instruction to access the V-Safe system.   Mr. Dimond was instructed to call 911 with any severe reactions post vaccine: Marland Kitchen Difficulty breathing  . Swelling of face and throat  . A fast heartbeat  . A bad rash all over body  . Dizziness and weakness   Immunizations Administered    Name Date Dose VIS Date Route   Pfizer COVID-19 Vaccine 12/20/2019 10:20 AM 0.3 mL 10/12/2018 Intramuscular   Manufacturer: [employer], [employer]   Lot: [id number]   NDC: [address]-8

## 2020-03-05 ENCOUNTER — Encounter (INDEPENDENT_AMBULATORY_CARE_PROVIDER_SITE_OTHER): Payer: Self-pay | Admitting: Vascular Surgery

## 2020-03-21 DIAGNOSIS — E785 Hyperlipidemia, unspecified: Secondary | ICD-10-CM | POA: Insufficient documentation

## 2020-03-21 DIAGNOSIS — I739 Peripheral vascular disease, unspecified: Secondary | ICD-10-CM | POA: Insufficient documentation

## 2020-03-21 DIAGNOSIS — M79606 Pain in leg, unspecified: Secondary | ICD-10-CM | POA: Insufficient documentation

## 2020-03-21 NOTE — Progress Notes (Deleted)
MRN : [id number]  Theodore Johnson is a 74 y.o. ([birth year]) male who presents with chief complaint of No chief complaint on file. Marland Kitchen  History of Present Illness:   The patient is seen for evaluation of painful lower extremities. Patient notes the pain is variable and not always associated with activity.  The pain is somewhat consistent day to day occurring on most days. The patient notes the pain also occurs with standing and routinely seems worse as the day wears on. The pain has been progressive over the past several years. The patient states these symptoms are causing  a profound negative impact on quality of life and daily activities.  The patient denies rest pain or dangling of an extremity off the side of the bed during the night for relief. No open wounds or sores at this time. No history of DVT or phlebitis. No prior interventions or surgeries.  There is a  history of back problems and DJD of the lumbar and sacral spine.    No outpatient medications have been marked as taking for the 03/22/20 encounter (Appointment) with Gilda Crease, Latina Craver, MD.    Past Medical History:  Diagnosis Date  . Cystitis   . Prostatitis, acute   . Solitary pulmonary nodule     Past Surgical History:  Procedure Laterality Date  . COLONOSCOPY  08/19/2007    Social History Social History   Tobacco Use  . Smoking status: Former Games developer  . Smokeless tobacco: Never Used  . Tobacco comment: Quit 37 years ago as of 08/06/16  Vaping Use  . Vaping Use: Never used  Substance Use Topics  . Alcohol use: Yes  . Drug use: Yes    Types: Marijuana    Comment: 2-3 times per week     Family History Family History  Problem Relation Age of Onset  . Hypertension Mother   . COPD Father   . Aneurysm Sister   ***  No Known Allergies   REVIEW OF SYSTEMS (Negative unless checked)  Constitutional: [] Weight loss  [] Fever  [] Chills Cardiac: [] Chest pain   [] Chest pressure   [] Palpitations    [] Shortness of breath when laying flat   [] Shortness of breath with exertion. Vascular:  [] Pain in legs with walking   [] Pain in legs at rest  [] History of DVT   [] Phlebitis   [] Swelling in legs   [] Varicose veins   [] Non-healing ulcers Pulmonary:   [] Uses home oxygen   [] Productive cough   [] Hemoptysis   [] Wheeze  [] COPD   [] Asthma Neurologic:  [] Dizziness   [] Seizures   [] History of stroke   [] History of TIA  [] Aphasia   [] Vissual changes   [] Weakness or numbness in arm   [] Weakness or numbness in leg Musculoskeletal:   [] Joint swelling   [] Joint pain   [] Low back pain Hematologic:  [] Easy bruising  [] Easy bleeding   [] Hypercoagulable state   [] Anemic Gastrointestinal:  [] Diarrhea   [] Vomiting  [] Gastroesophageal reflux/heartburn   [] Difficulty swallowing. Genitourinary:  [] Chronic kidney disease   [] Difficult urination  [] Frequent urination   [] Blood in urine Skin:  [] Rashes   [] Ulcers  Psychological:  [] History of anxiety   []  History of major depression.  Physical Examination  There were no vitals filed for this visit. There is no height or weight on file to calculate BMI. Gen: WD/WN, NAD Head: /AT, No temporalis wasting.  Ear/Nose/Throat: Hearing grossly intact, nares w/o erythema or drainage, poor dentition Eyes: PER, EOMI, sclera nonicteric.  Neck: Supple,  no masses.  No bruit or JVD.  Pulmonary:  Good air movement, clear to auscultation bilaterally, no use of accessory muscles.  Cardiac: RRR, normal S1, S2, no Murmurs. Vascular: *** Vessel Right Left  Radial Palpable Palpable  Ulnar Palpable Palpable  Brachial Palpable Palpable  Carotid Palpable Palpable  Femoral Palpable Palpable  Popliteal Palpable Palpable  PT Palpable Palpable  DP Palpable Palpable   Gastrointestinal: soft, non-distended. No guarding/no peritoneal signs.  Musculoskeletal: M/S 5/5 throughout.  No deformity or atrophy.  Neurologic: CN 2-12 intact. Pain and light touch intact in extremities.   Symmetrical.  Speech is fluent. Motor exam as listed above. Psychiatric: Judgment intact, Mood & affect appropriate for pt's clinical situation. Dermatologic: No rashes or ulcers noted.  No changes consistent with cellulitis. Lymph : No Cervical lymphadenopathy, no lichenification or skin changes of chronic lymphedema.  CBC Lab Results  Component Value Date   WBC 6.3 11/14/2018   HGB 14.3 11/14/2018   HCT 43.3 11/14/2018   MCV 87.7 11/14/2018   PLT 223 11/14/2018    BMET    Component Value Date/Time   NA 138 11/14/2018 0246   K 3.5 11/14/2018 0246   CL 103 11/14/2018 0246   CO2 26 11/14/2018 0246   GLUCOSE 105 (H) 11/14/2018 0246   BUN 9 11/14/2018 0246   CREATININE 1.13 11/14/2018 0246   CREATININE 0.99 08/06/2016 1215   CALCIUM 9.6 11/14/2018 0246   GFRNONAA >60 11/14/2018 0246   GFRNONAA 78 08/06/2016 1215   GFRAA >60 11/14/2018 0246   GFRAA >89 08/06/2016 1215   CrCl cannot be calculated (Patient's most recent lab result is older than the maximum 21 days allowed.).  COAG No results found for: INR, PROTIME  Radiology No results found.  Outside Studies/Documentation *** pages of outside documents were reviewed.  They showed ***.  Assessment/Plan There are no diagnoses linked to this encounter.   Levora Dredge, MD  03/21/2020 11:11 AM

## 2020-03-22 ENCOUNTER — Encounter (INDEPENDENT_AMBULATORY_CARE_PROVIDER_SITE_OTHER): Payer: Self-pay | Admitting: Vascular Surgery

## 2020-12-03 IMAGING — US ULTRASOUND ABDOMEN LIMITED
1 series · 14 of 25 positions shown · non-contrast
Comparison: Partial comparison to CT chest dated 05/08/2017

CLINICAL DATA: Elevated LFTs, right upper quadrant pain x1 day

EXAM:
ULTRASOUND ABDOMEN LIMITED RIGHT UPPER QUADRANT

[Series 1: ultrasound abdomen limited · 14 of 52 slices shown]
[im 1/52]
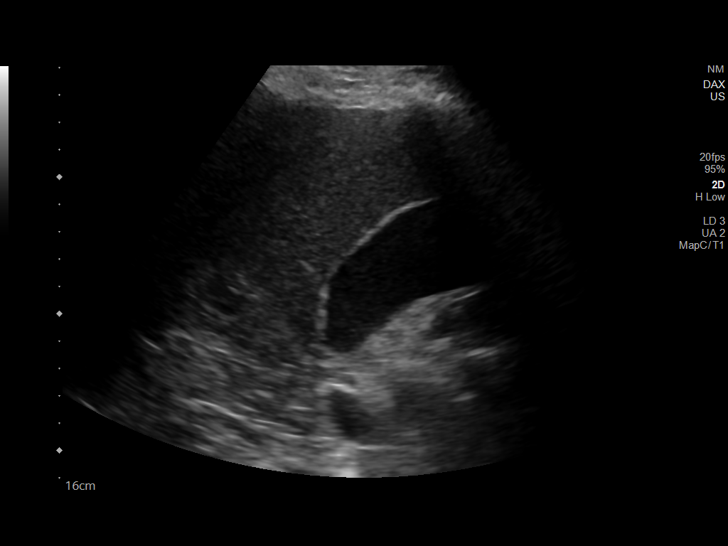
[im 5/52]
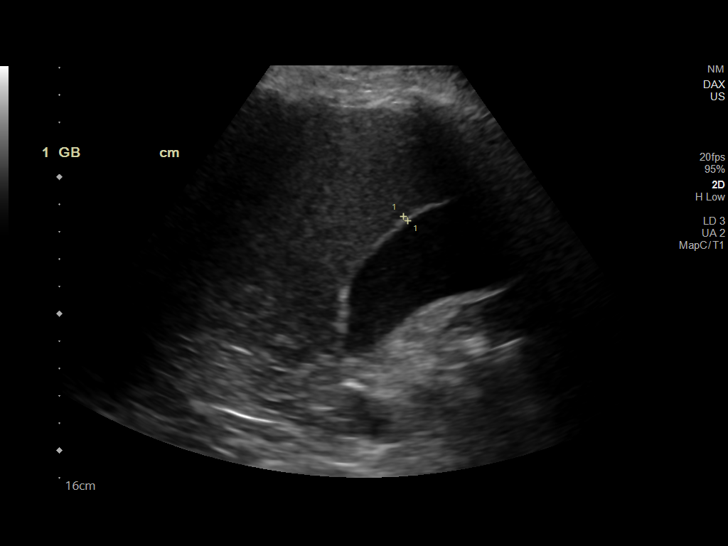
[im 9/52]
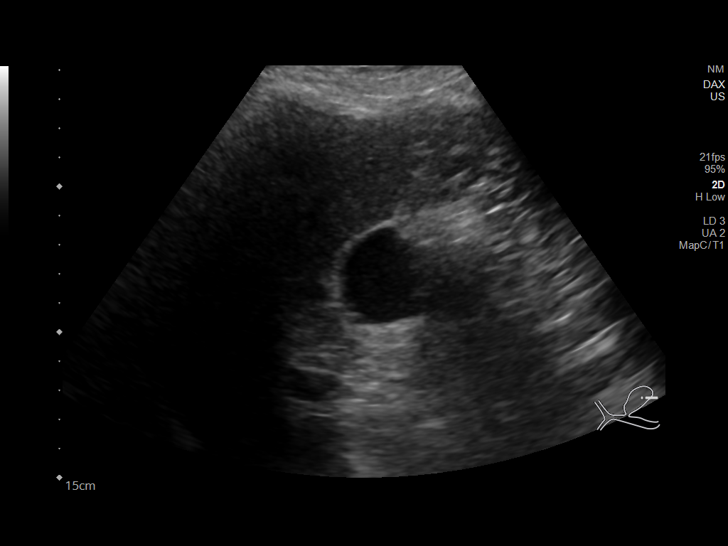
[im 13/52]
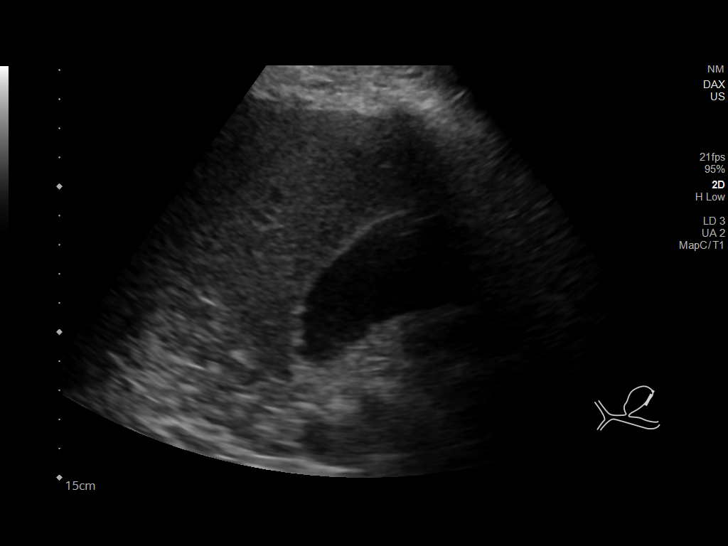
[im 18/52]
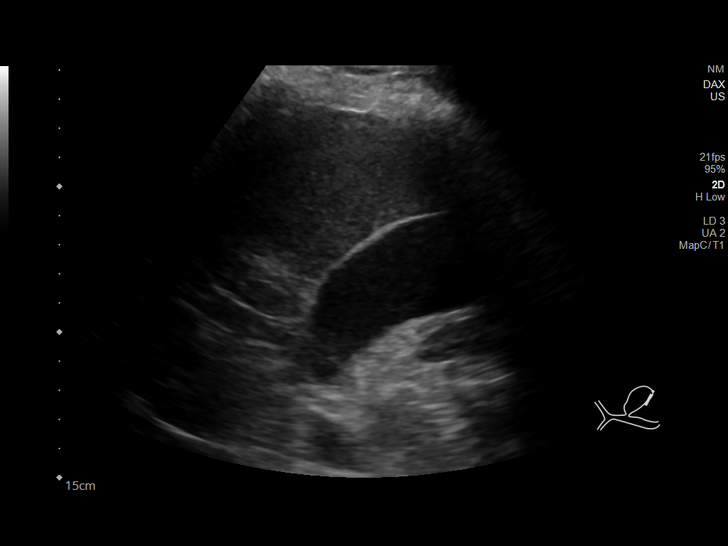
[im 20/52]
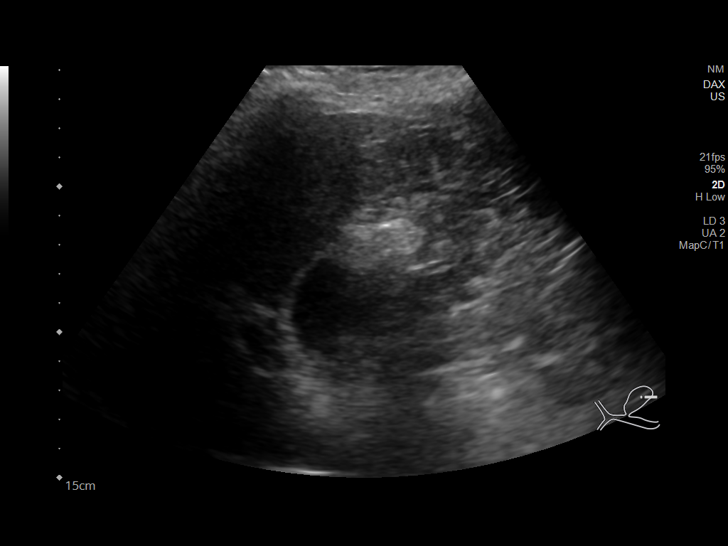
[im 24/52]
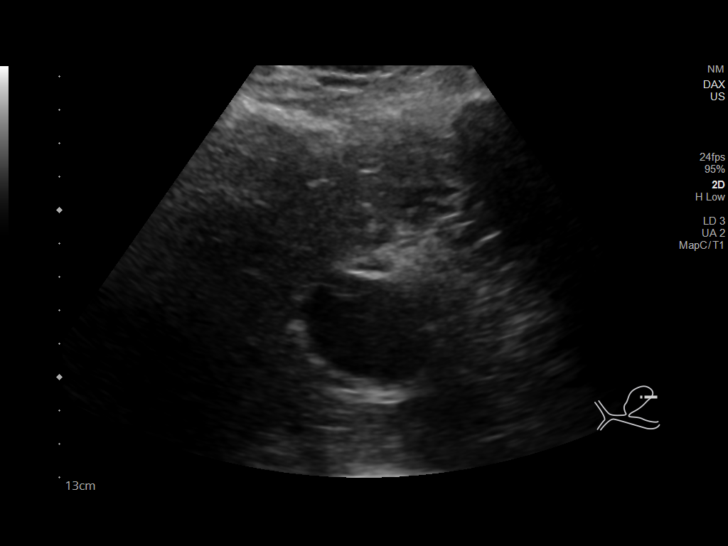
[im 28/52]
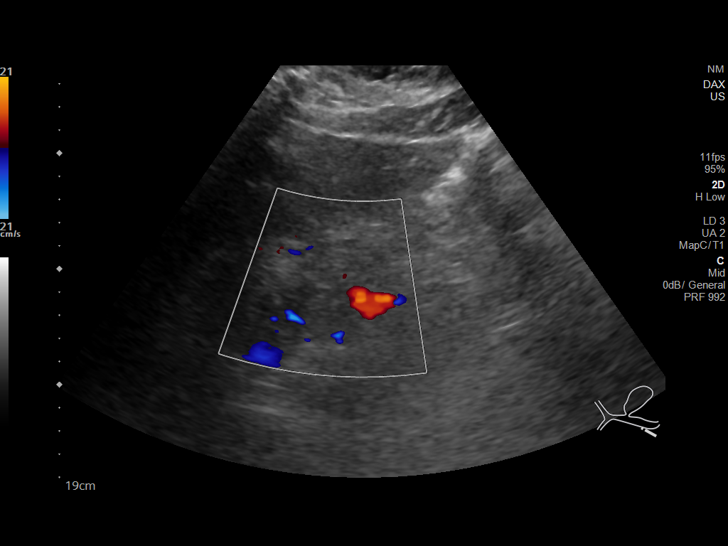
[im 32/52]
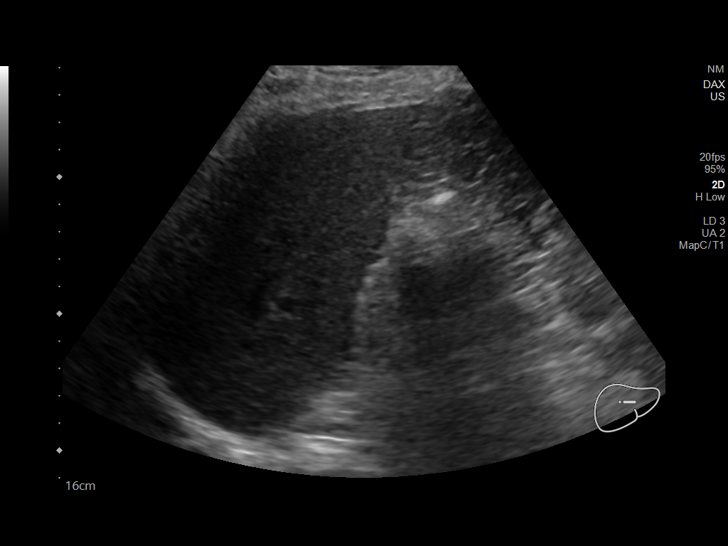
[im 35/52]
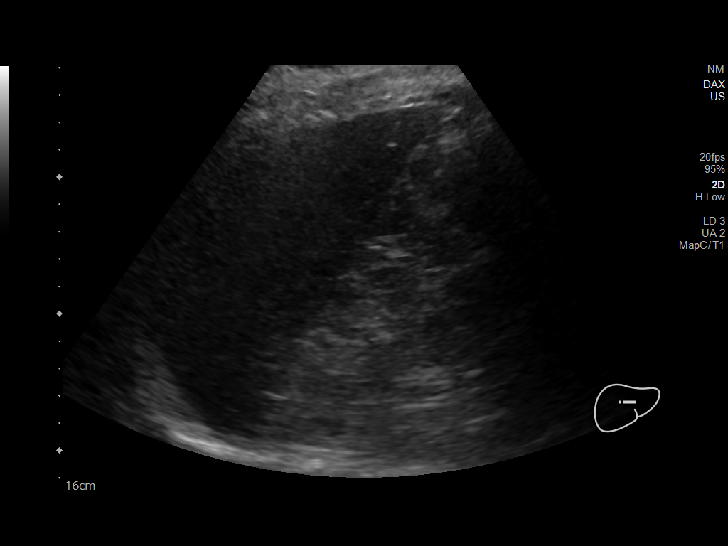
[im 39/52]
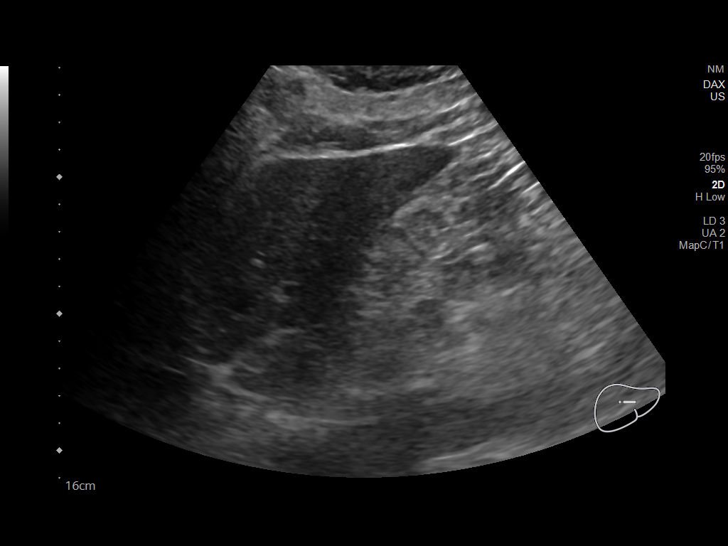
[im 43/52]
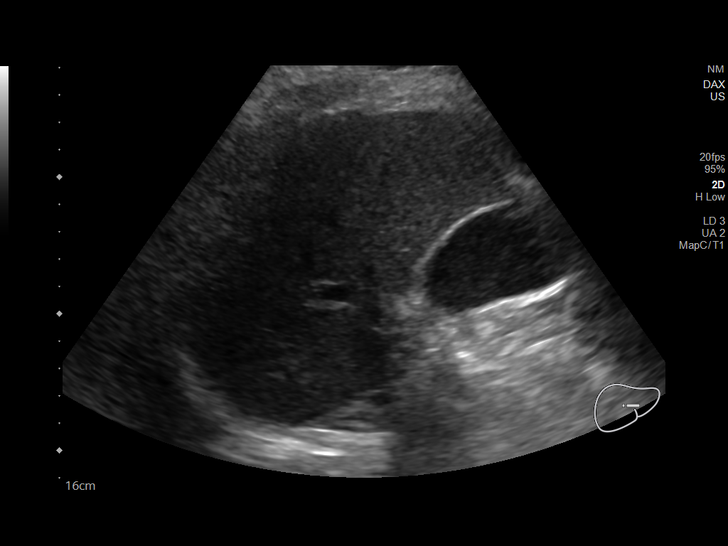
[im 47/52]
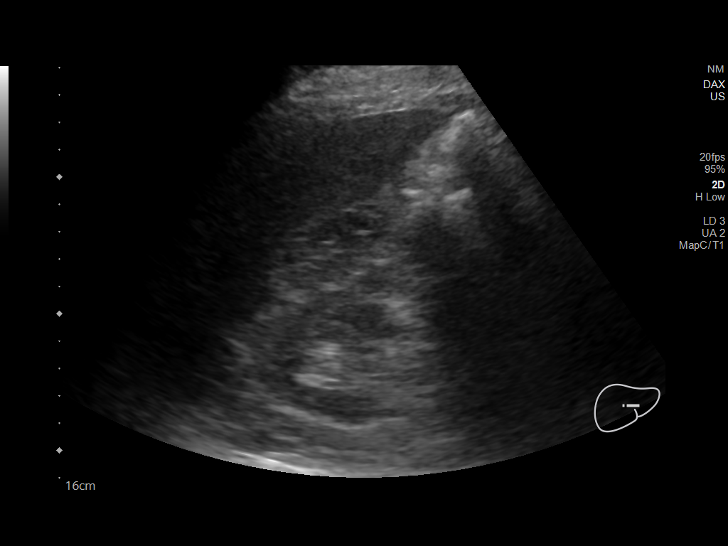
[im 52/52]
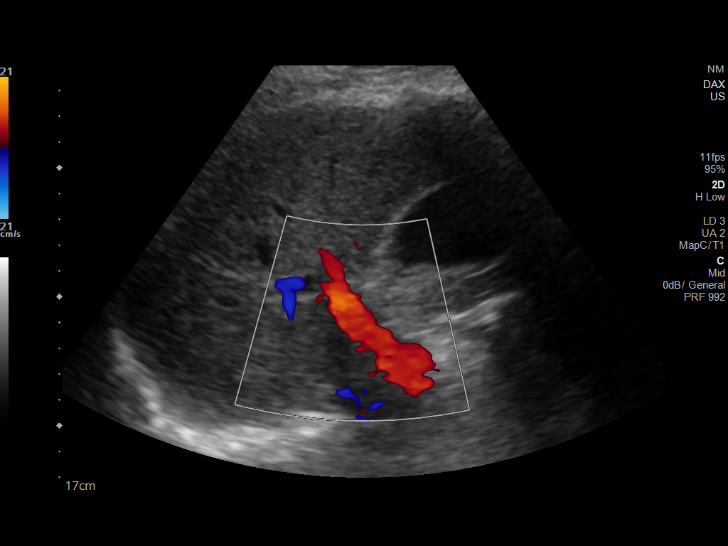

[14 of 25 positions shown; findings below may reference images not displayed]

FINDINGS: Gallbladder:

9 mm gallstone with gallbladder sludge. No gallbladder wall
thickening or pericholecystic fluid. Negative sonographic Murphy's
sign.

Common bile duct:

Diameter: 5 mm

Liver:

No focal lesion identified. Within normal limits in parenchymal
echogenicity. Mildly nodular hepatic contour. Portal vein is patent
on color Doppler imaging with normal direction of blood flow towards
the liver.
IMPRESSION: Cholelithiasis, without associated inflammatory changes to suggest
acute cholecystitis.

## 2021-07-18 ENCOUNTER — Other Ambulatory Visit: Payer: Self-pay | Admitting: General Surgery

## 2021-07-18 NOTE — Progress Notes (Signed)
Subjective:     Patient ID: Theodore Johnson is a 75 y.o. male.   HPI   The following portions of the patient's history were reviewed and updated as appropriate.   This a new patient is here today for: office visit. Here for hemoccult positive stool referred by Dr [place]. Patient reports his last colonoscopy done more than 10 years ago in [place]. The patient did not recall them finding any polyps at the time of his last exam. Patient reports bleeding on toilet paper is he eats spicy foods, fried foods, or has constipation.  Its not uncommon that his stool be firm at the beginning of the movement and then soften.  He denies any mucus.   The patient was born in [place], but lived in [place] for [place] years.  Returned home after retirement.  He had been a Psychologist, occupational. Review of Systems  Constitutional: Negative for chills and fever.  Respiratory: Negative for cough.          Chief Complaint  Patient presents with   Pre-op Exam      BP (!) 162/80   Pulse 84   Temp 36.7 C (98.1 F)   Ht 188 cm (6\' 2" )   Wt (!) 107 kg (236 lb)   SpO2 97%   BMI 30.30 kg/m        Past Medical History:  Diagnosis Date   Cystitis     History of degenerative disc disease     Hyperlipidemia     Prostatitis     Pulmonary nodule             Past Surgical History:  Procedure Laterality Date   COLONOSCOPY   2009   LAPAROSCOPIC CHOLECYSTECTOMY              Social History           Socioeconomic History   Marital status: Divorced  Tobacco Use   Smoking status: Former      Types: Cigarettes      Quit date: 08/18/1978      Years since quitting: 42.9   Smokeless tobacco: Never  Substance and Sexual Activity   Alcohol use: Yes      Comment: occas   Drug use: Not Currently        No Known Allergies   Current Medications        Current Outpatient Medications  Medication Sig Dispense Refill   atorvastatin (LIPITOR) 40 MG tablet         docosahexaenoic acid/epa (FISH OIL ORAL) Take by  mouth once daily       folic acid/multivit-min/lutein (CENTRUM SILVER ORAL) Take by mouth once daily       sildenafiL (VIAGRA) 100 MG tablet Take 1 tablet (100 mg total) by mouth as directed        No current facility-administered medications for this visit.             Family History  Problem Relation Age of Onset   High blood pressure (Hypertension) Mother     COPD Father     Aneurysm Sister     Breast cancer Neg Hx     Colon cancer Neg Hx               Objective:   Physical Exam Constitutional:      Appearance: Normal appearance.  Cardiovascular:     Rate and Rhythm: Normal rate and regular rhythm.     Pulses: Normal pulses.  Heart sounds: Normal heart sounds.  Pulmonary:     Effort: Pulmonary effort is normal.     Breath sounds: Normal breath sounds.  Musculoskeletal:     Cervical back: Neck supple.  Skin:    General: Skin is warm and dry.  Neurological:     Mental Status: He is alert and oriented to person, place, and time.  Psychiatric:        Mood and Affect: Mood normal.        Behavior: Behavior normal.           Assessment:     Intermittent rectal bleeding, likely anorectal source.    Plan:     Colonoscopy would be appropriate to confirm the anus is the source of the intermittent rectal bleeding and not a higher pathology.   Risks of the procedure were reviewed.   He was instructed in regards to preparation by the staff.          This note is partially prepared by Theodore Daft, RN, acting as a scribe in the presence of Dr. Donnalee Curry, MD.  The documentation recorded by the scribe accurately reflects the service I personally performed and the decisions made by me.    Theodore Mayotte, MD FACS

## 2021-08-28 ENCOUNTER — Ambulatory Visit
Admission: RE | Admit: 2021-08-28 | Payer: Commercial Managed Care - HMO | Source: Home / Self Care | Admitting: General Surgery

## 2021-08-28 ENCOUNTER — Encounter: Admission: RE | Payer: Self-pay | Source: Home / Self Care

## 2021-08-28 SURGERY — COLONOSCOPY WITH PROPOFOL
Anesthesia: General

## 2022-09-29 ENCOUNTER — Other Ambulatory Visit: Payer: Self-pay | Admitting: Internal Medicine

## 2022-09-29 DIAGNOSIS — R911 Solitary pulmonary nodule: Secondary | ICD-10-CM

## 2022-10-13 ENCOUNTER — Encounter: Payer: Self-pay | Admitting: Internal Medicine

## 2022-10-13 ENCOUNTER — Other Ambulatory Visit: Payer: Self-pay | Admitting: Internal Medicine

## 2022-10-13 DIAGNOSIS — R911 Solitary pulmonary nodule: Secondary | ICD-10-CM

## 2022-10-13 DIAGNOSIS — I77810 Thoracic aortic ectasia: Secondary | ICD-10-CM

## 2022-10-13 DIAGNOSIS — J439 Emphysema, unspecified: Secondary | ICD-10-CM

## 2022-10-14 ENCOUNTER — Ambulatory Visit
Admission: RE | Admit: 2022-10-14 | Discharge: 2022-10-14 | Disposition: A | Discharge status: Home / Self Care | Payer: 59 | Source: Ambulatory Visit | Attending: Internal Medicine | Admitting: Internal Medicine

## 2022-10-14 DIAGNOSIS — I77810 Thoracic aortic ectasia: Secondary | ICD-10-CM | POA: Diagnosis present

## 2022-10-14 DIAGNOSIS — R911 Solitary pulmonary nodule: Secondary | ICD-10-CM | POA: Insufficient documentation

## 2022-10-14 MED ORDER — IOHEXOL 350 MG/ML SOLN
75.0000 mL | Freq: Once | INTRAVENOUS | Status: AC | PRN
  Administered 2022-10-14: 75 mL via INTRAVENOUS
